# Patient Record
Sex: Male | Born: 1950 | Race: Black or African American | Hispanic: No | Marital: Married | Smoking: Former smoker
Health system: Southern US, Community
[De-identification: ages and names within clinical notes are randomized; demographics above are authoritative.]

## PROBLEM LIST (undated history)

## (undated) DIAGNOSIS — E785 Hyperlipidemia, unspecified: Secondary | ICD-10-CM

## (undated) DIAGNOSIS — I2510 Atherosclerotic heart disease of native coronary artery without angina pectoris: Secondary | ICD-10-CM

## (undated) DIAGNOSIS — F1010 Alcohol abuse, uncomplicated: Secondary | ICD-10-CM

## (undated) DIAGNOSIS — I2129 ST elevation (STEMI) myocardial infarction involving other sites: Secondary | ICD-10-CM

## (undated) DIAGNOSIS — I10 Essential (primary) hypertension: Secondary | ICD-10-CM

## (undated) HISTORY — DX: Essential (primary) hypertension: I10

## (undated) HISTORY — DX: Alcohol abuse, uncomplicated: F10.10

## (undated) HISTORY — DX: Atherosclerotic heart disease of native coronary artery without angina pectoris: I25.10

## (undated) HISTORY — DX: Hyperlipidemia, unspecified: E78.5

## (undated) HISTORY — DX: ST elevation (STEMI) myocardial infarction involving other sites: I21.29

## (undated) HISTORY — PX: OTHER SURGICAL HISTORY: SHX169

---

## 2006-02-18 ENCOUNTER — Emergency Department (HOSPITAL_COMMUNITY): Admission: EM | Admit: 2006-02-18 | Discharge: 2006-02-18 | Payer: Self-pay | Admitting: Emergency Medicine

## 2008-11-10 ENCOUNTER — Emergency Department (HOSPITAL_COMMUNITY): Admission: EM | Admit: 2008-11-10 | Discharge: 2008-11-10 | Payer: Self-pay | Admitting: Emergency Medicine

## 2009-02-24 ENCOUNTER — Ambulatory Visit: Payer: Self-pay | Admitting: Cardiology

## 2009-02-24 ENCOUNTER — Inpatient Hospital Stay (HOSPITAL_COMMUNITY): Admission: AD | Admit: 2009-02-24 | Discharge: 2009-02-26 | Payer: Self-pay | Admitting: Cardiology

## 2009-02-27 ENCOUNTER — Emergency Department (HOSPITAL_COMMUNITY): Admission: EM | Admit: 2009-02-27 | Discharge: 2009-02-27 | Payer: Self-pay | Admitting: Emergency Medicine

## 2009-02-27 ENCOUNTER — Ambulatory Visit: Payer: Self-pay | Admitting: Cardiology

## 2009-02-28 ENCOUNTER — Telehealth: Payer: Self-pay | Admitting: Cardiology

## 2009-03-07 ENCOUNTER — Encounter: Payer: Self-pay | Admitting: Cardiology

## 2009-03-07 DIAGNOSIS — I10 Essential (primary) hypertension: Secondary | ICD-10-CM | POA: Insufficient documentation

## 2009-03-07 DIAGNOSIS — I2510 Atherosclerotic heart disease of native coronary artery without angina pectoris: Secondary | ICD-10-CM | POA: Insufficient documentation

## 2009-03-07 DIAGNOSIS — E785 Hyperlipidemia, unspecified: Secondary | ICD-10-CM

## 2009-03-09 ENCOUNTER — Ambulatory Visit: Payer: Self-pay | Admitting: Cardiology

## 2009-03-09 ENCOUNTER — Encounter (INDEPENDENT_AMBULATORY_CARE_PROVIDER_SITE_OTHER): Payer: Self-pay | Admitting: *Deleted

## 2009-03-17 ENCOUNTER — Encounter: Payer: Self-pay | Admitting: Cardiology

## 2009-04-19 ENCOUNTER — Telehealth: Payer: Self-pay | Admitting: Cardiology

## 2009-04-27 ENCOUNTER — Ambulatory Visit: Payer: Self-pay | Admitting: Cardiology

## 2009-05-12 ENCOUNTER — Ambulatory Visit: Payer: Self-pay | Admitting: Cardiology

## 2009-05-17 ENCOUNTER — Encounter: Payer: Self-pay | Admitting: Cardiology

## 2009-05-19 LAB — CONVERTED CEMR LAB
AST: 21 units/L (ref 0–37)
Albumin: 4.4 g/dL (ref 3.5–5.2)
Alkaline Phosphatase: 105 units/L (ref 39–117)
BUN: 20 mg/dL (ref 6–23)
Basophils Absolute: 0 10*3/uL (ref 0.0–0.1)
Chloride: 101 meq/L (ref 96–112)
Eosinophils Relative: 2 % (ref 0–5)
HCT: 40.8 % (ref 39.0–52.0)
HDL: 32 mg/dL — ABNORMAL LOW (ref >39)
LDL Cholesterol: 112 mg/dL — ABNORMAL HIGH (ref 0–99)
Lymphocytes Relative: 35 % (ref 12–46)
Lymphs Abs: 4.5 10*3/uL — ABNORMAL HIGH (ref 0.7–4.0)
Neutro Abs: 7.1 10*3/uL (ref 1.7–7.7)
Platelets: 226 10*3/uL (ref 150–400)
Potassium: 3.8 meq/L (ref 3.5–5.3)
RDW: 14 % (ref 11.5–15.5)
Sodium: 138 meq/L (ref 135–145)
Total Protein: 7.8 g/dL (ref 6.0–8.3)
Triglycerides: 131 mg/dL (ref <150)
WBC: 12.7 10*3/uL — ABNORMAL HIGH (ref 4.0–10.5)

## 2009-06-20 ENCOUNTER — Telehealth: Payer: Self-pay | Admitting: Cardiology

## 2009-06-28 ENCOUNTER — Telehealth: Payer: Self-pay | Admitting: Cardiology

## 2009-07-10 ENCOUNTER — Telehealth: Payer: Self-pay | Admitting: Cardiology

## 2009-07-19 ENCOUNTER — Telehealth: Payer: Self-pay | Admitting: Cardiology

## 2009-07-27 ENCOUNTER — Telehealth: Payer: Self-pay | Admitting: Cardiology

## 2009-08-17 ENCOUNTER — Encounter: Payer: Self-pay | Admitting: Cardiology

## 2009-08-17 ENCOUNTER — Telehealth: Payer: Self-pay | Admitting: Cardiology

## 2009-08-25 ENCOUNTER — Ambulatory Visit: Payer: Self-pay | Admitting: Cardiology

## 2009-08-31 ENCOUNTER — Telehealth: Payer: Self-pay | Admitting: Cardiology

## 2009-09-22 ENCOUNTER — Telehealth: Payer: Self-pay | Admitting: Cardiology

## 2009-09-26 ENCOUNTER — Telehealth: Payer: Self-pay | Admitting: Cardiology

## 2009-10-04 ENCOUNTER — Telehealth (INDEPENDENT_AMBULATORY_CARE_PROVIDER_SITE_OTHER): Payer: Self-pay | Admitting: *Deleted

## 2009-10-09 ENCOUNTER — Telehealth: Payer: Self-pay | Admitting: Cardiology

## 2009-10-17 ENCOUNTER — Telehealth (INDEPENDENT_AMBULATORY_CARE_PROVIDER_SITE_OTHER): Payer: Self-pay | Admitting: *Deleted

## 2009-11-14 ENCOUNTER — Telehealth: Payer: Self-pay | Admitting: Cardiology

## 2009-11-15 ENCOUNTER — Ambulatory Visit: Payer: Self-pay | Admitting: Cardiology

## 2009-11-20 LAB — CONVERTED CEMR LAB
AST: 36 units/L (ref 0–37)
Albumin: 4.2 g/dL (ref 3.5–5.2)
Alkaline Phosphatase: 83 units/L (ref 39–117)
Cholesterol: 145 mg/dL (ref 0–200)
HDL: 33.9 mg/dL — ABNORMAL LOW (ref >39.00)
Total Protein: 7.4 g/dL (ref 6.0–8.3)

## 2009-11-27 ENCOUNTER — Telehealth: Payer: Self-pay | Admitting: Cardiology

## 2009-12-11 ENCOUNTER — Telehealth: Payer: Self-pay | Admitting: Cardiology

## 2010-01-02 ENCOUNTER — Telehealth: Payer: Self-pay | Admitting: Cardiology

## 2010-01-05 ENCOUNTER — Telehealth: Payer: Self-pay | Admitting: Cardiology

## 2010-02-05 ENCOUNTER — Telehealth: Payer: Self-pay | Admitting: Cardiology

## 2010-02-12 ENCOUNTER — Ambulatory Visit: Payer: Self-pay | Admitting: Cardiology

## 2010-02-19 ENCOUNTER — Ambulatory Visit: Payer: Self-pay | Admitting: Cardiology

## 2010-02-22 LAB — CONVERTED CEMR LAB
BUN: 20 mg/dL (ref 6–23)
CO2: 27 meq/L (ref 19–32)
Glucose, Bld: 92 mg/dL (ref 70–99)
Potassium: 3.6 meq/L (ref 3.5–5.1)
Sodium: 137 meq/L (ref 135–145)

## 2010-03-23 ENCOUNTER — Telehealth: Payer: Self-pay | Admitting: Cardiology

## 2010-04-16 ENCOUNTER — Telehealth: Payer: Self-pay | Admitting: Cardiology

## 2010-05-17 ENCOUNTER — Telehealth: Payer: Self-pay | Admitting: Cardiovascular Disease

## 2010-05-29 NOTE — Progress Notes (Signed)
Summary: sample of plavix/ LMTCB  Phone Note Call from Patient Call back at Home Phone [phone] Call back at [phone]   Caller: Spouse Reason for Call: Talk to Nurse Summary of Call: sample of plavix 75 mg.  Initial call taken by: Lorne Skeens,  Sep 26, 2009 8:34 AM  Follow-up for Phone Call        LMTCB--samples of Plavix 75 #12 on top of message cart--applicatipn  for BMS pt assistance program with samples Katina Dung, RN, BSN  Sep 26, 2009 1:58 PM   Pt's wife aware samples and application for BMS have been placed at front desk. Follow-up by: Charolotte Capuchin, RN,  Sep 26, 2009 2:17 PM

## 2010-05-29 NOTE — Progress Notes (Signed)
Summary: c/o dizziness  Phone Note Call from Patient Call back at Home Phone [phone]   Refills Requested: Medication #1:  HCL 0.1 MG TABS Take one tablet by mouth once daily x 4 days then stop Caller: Spouse Reason for Call: Talk to Nurse Details for Reason: Per pt wife calling, wants to discuss medication, CLONIDINE c/o dizziness. wife under the impression that pt was suppose to stop meds.  Initial call taken by: Lorne Skeens,  June 20, 2009 3:11 PM  Follow-up for Phone Call        S/W pt's wife and she thought that he was supposed to have stopped his Clonidine per his last OV. I confirmed that with her. She said he has been very dizzy lately. She noticed that [facility] and he has been taking it like the bottle said which is 0.1mg  two times a day. So along with his increase in Lisinopril to 20mg  daily per last OV he has remained on his clonidine. He is not there for her to check his BP for me. I ask her to check it when he gets home that his BP is probably low for him. I s/w Shelby Dubin to see if he could just stop the clonidine or if he needed to taper it. She ask that he taper. He was instructed to take 0.1mg  tablet tonight (he did not take it this morning), tomorrow night and Thurs night. I will pass this along to Geroge Baseman, RN and to Dr. Juanda Chance in case any more changes need to be made. Pt's wife understands instructions.  Follow-up by: Duncan Dull, RN, BSN,  June 20, 2009 4:08 PM  Additional Follow-up for Phone Call Additional follow up Details #1::        ( late entry) I reviewed the above with Dr. Juanda Chance late yesterday after clinic. Per Dr. Juanda Chance, he is agreeable with the above. No further changes per MD. I called and left a message at the pt's home # that Dr. Juanda Chance was aware and agreeable with the above plan. Additional Follow-up by: Sherri Rad, RN, BSN,  June 22, 2009 9:31 AM

## 2010-05-29 NOTE — Progress Notes (Signed)
Summary: samples of crestor  Phone Note Call from Patient Call back at Home Phone [phone]   Caller: Spouse- patricia  Reason for Call: Talk to Nurse Details for Reason: sample of crestor 40 mg.  Initial call taken by: Lorne Skeens,  December 11, 2009 8:32 AM  Follow-up for Phone Call        spoke with spouse, samples of crestor 20mg  given [name], RN  December 11, 2009 8:46 AM

## 2010-05-29 NOTE — Progress Notes (Signed)
Summary: Samples of Plavix 40 mg  Phone Note Call from Patient Call back at [facility] Phone [phone]   Caller: Patient Summary of Call: Samples of Plavix 40mg  Initial call taken by: Judie Grieve,  February 05, 2010 2:46 PM  Follow-up for Phone Call        Wife is aware samples will be at the desk. Mylo Red RN

## 2010-05-29 NOTE — Progress Notes (Signed)
Summary: NEED SAMPLES OF PLAVIX 75MG   Phone Note Call from Patient Call back at Home Phone [phone]   Caller: Patient Summary of Call: PT NEED SAMPLES  PLAVIX 75MG  Initial call taken by: Judie Grieve,  July 10, 2009 2:57 PM  Follow-up for Phone Call        got samples gave pt 2 boxes of 4 tablets Follow-up by: Kem Parkinson,  July 10, 2009 3:27 PM

## 2010-05-29 NOTE — Progress Notes (Signed)
  Walk in Patient Form Recieved " Pt left [employer] Form" sent to Message Nurse [facility]  October 04, 2009 8:24 AM

## 2010-05-29 NOTE — Assessment & Plan Note (Signed)
Summary: 4 MONTH ROV   Visit Type:  Follow-up Primary Provider:  dr Deforest Hoyles  CC:  no complaints.  History of Present Illness: Patient is 60 years old and return for management of CAD. 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 He has done well since that time has had no recent chest pain shortness of breath or palpitations. 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  His other problems include hypertension hyperlipidemia. When he was here last visit we DC'd his clonidine increase his lisinopril.  His main complaint today is pain in his right heel which he notices most when he gets up in the morning and walks.  Current Medications (verified): 1)  Crestor 40 Mg Tabs (Rosuvastatin Calcium) .... Take One Tablet By Mouth Daily. 2)  Metoprolol Succinate 50 Mg Xr24h-Tab (Metoprolol Succinate) .... Take One Tablet By Mouth Daily 3)  Hydrochlorothiazide 25 Mg Tabs (Hydrochlorothiazide) .... Take One Tablet By Mouth Daily. 4)  Lisinopril 20 Mg Tabs (Lisinopril) .... Take One Tablet By Mouth Daily 5)  Nitrostat 0.4 Mg Subl (Nitroglycerin) .Marland Kitchen.. 1 Tablet Under Tongue At Onset of Chest Pain; You May Repeat Every 5 Minutes For Up To 3 Doses. 6)  Plavix 75 Mg Tabs (Clopidogrel Bisulfate) .Marland Kitchen.. 1 Tab Once Daily 7)  Aspirin Ec 325 Mg Tbec (Aspirin) .... Take One Tablet By Mouth Daily  Allergies (verified): No Known Drug Allergies  Past History:  Past Medical History: Reviewed history from 03/07/2009 and no changes required. Current Problems:  CAD (ICD-414.00) HYPERLIPIDEMIA (ICD-272.4) HYPERTENSION (ICD-401.9)  Acute inferolateral ST-elevation myocardial infarction.  Epididymo-  Testicular pain    orchitis left   Review of Systems       ROS is negative except as outlined in HPI.   Vital Signs:  Patient profile:   60 year old male Height:      70 inches Weight:      178 pounds BMI:     25.63 Pulse rate:   59 / minute BP sitting:    140 / 88  (left arm) Cuff size:   large  Vitals Entered By: Burnett Kanaris, CNA (August 25, 2009 4:27 PM)  Physical Exam  Additional Exam:  Gen. Well-nourished, in no distress   Neck: No JVD, thyroid not enlarged, no carotid bruits Lungs: No tachypnea, clear without rales, rhonchi or wheezes Cardiovascular: Rhythm regular, PMI not displaced,  heart sounds  normal, no murmurs or gallops, no peripheral edema, pulses normal in all 4 extremities. Abdomen: BS normal, abdomen soft and non-tender without masses or organomegaly, no hepatosplenomegaly. MS: No deformities, no cyanosis or clubbing   Neuro:  No focal sns   Skin:  no lesions    Impression & Recommendations:  Problem # 1:  CAD (ICD-414.00)  He had an inferior MI in October of 2010 3 with a drug least into the right coronary artery. He has had no chest pain and this problem appears stable. His updated medication list for this problem includes:    Metoprolol Succinate 50 Mg Xr24h-tab (Metoprolol succinate) .Marland Kitchen... Take one tablet by mouth daily    Lisinopril 20 Mg Tabs (Lisinopril) .Marland Kitchen... Take one tablet by mouth daily    Nitrostat 0.4 Mg Subl (Nitroglycerin) .Marland Kitchen... 1 tablet under tongue at onset of chest pain; you may repeat every 5 minutes for up to 3 doses.    Plavix 75 Mg Tabs (Clopidogrel bisulfate) .Marland Kitchen... 1 tab once daily  Aspirin Ec 325 Mg Tbec (Aspirin) .Marland Kitchen... Take one tablet by mouth daily  Orders: EKG w/ Interpretation (93000)  Problem # 2:  HYPERTENSION (ICD-401.9)  His blood pressure is borderline today. We will continue current medications and observation. The following medications were removed from the medication list:    Clonidine Hcl 0.1 Mg Tabs (Clonidine hcl) .Marland Kitchen... Take one tablet by mouth once daily x 4 days then stop His updated medication list for this problem includes:    Metoprolol Succinate 50 Mg Xr24h-tab (Metoprolol succinate) .Marland Kitchen... Take one tablet by mouth daily    Hydrochlorothiazide 25 Mg Tabs  (Hydrochlorothiazide) .Marland Kitchen... Take one tablet by mouth daily.    Lisinopril 20 Mg Tabs (Lisinopril) .Marland Kitchen... Take one tablet by mouth daily    Aspirin Ec 325 Mg Tbec (Aspirin) .Marland Kitchen... Take one tablet by mouth daily  Orders: EKG w/ Interpretation (93000)  Problem # 3:  HYPERLIPIDEMIA (ICD-272.4)  His cholesterol is not at target. We think he was on a different dose of Crestor at the time this was done and we will plan a repeat lipid profile. His updated medication list for this problem includes:    Crestor 40 Mg Tabs (Rosuvastatin calcium) .Marland Kitchen... Take one tablet by mouth daily.  Orders: EKG w/ Interpretation (93000)  Patient Instructions: 1)  Your physician recommends that you return for lab work on a day you can come FASTING: lipid/liver (414.01;272.2) 2)  Your physician has recommended you make the following change in your medication: 1) START advil 400mg  two times a day  3)  Your physician wants you to follow-up in: 6 months.   You will receive a reminder letter in the mail two months in advance. If you don't receive a letter, please call our office to schedule the follow-up appointment. 4)  You have been given an order for an ankle splint to wear at night.

## 2010-05-29 NOTE — Progress Notes (Signed)
Summary: plavix samples  Phone Note Call from Patient Call back at [phone]   Caller: Spouse Reason for Call: Talk to Nurse Summary of Call: request samples of plavix Initial call taken by: Migdalia Dk,  Aug 31, 2009 8:36 AM  Follow-up for Phone Call        [facility]    Samples of Plavix are ready. Lisabeth Devoid RN 09/01/09--spoke with wife and she states they picked up plavix samples 08/31/09--nt Follow-up by: Ledon Snare, RN,  Sep 01, 2009 8:41 AM

## 2010-05-29 NOTE — Assessment & Plan Note (Signed)
Summary: f26m   Visit Type:  Follow-up Primary Tirzah Fross:  dr Deforest Hoyles   History of Present Illness: Patient is 60 years old and return for management of CAD. 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 He has done well since that time has had no recent chest pain shortness of breath or palpitations. 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  His other problems include hypertension hyperlipidemia.   His main complaint today is pain in his right heel which he notices most when he gets up in the morning and walks.  Current Medications (verified): 1)  Crestor 40 Mg Tabs (Rosuvastatin Calcium) .... Take One Tablet By Mouth Daily. 2)  Metoprolol Succinate 50 Mg Xr24h-Tab (Metoprolol Succinate) .... Take One Tablet By Mouth Daily 3)  Hydrochlorothiazide 25 Mg Tabs (Hydrochlorothiazide) .... Take One Tablet By Mouth Daily. 4)  Lisinopril 20 Mg Tabs (Lisinopril) .... Take One Tablet By Mouth Daily 5)  Nitrostat 0.4 Mg Subl (Nitroglycerin) .Marland Kitchen.. 1 Tablet Under Tongue At Onset of Chest Pain; You May Repeat Every 5 Minutes For Up To 3 Doses. 6)  Plavix 75 Mg Tabs (Clopidogrel Bisulfate) .Marland Kitchen.. 1 Tab Once Daily 7)  Aspirin Ec 325 Mg Tbec (Aspirin) .... Take One Tablet By Mouth Daily 8)  Advil 200 Mg Tabs (Ibuprofen) .... Take Two Tabs Two Times A Day  Allergies (verified): No Known Drug Allergies  Past History:  Past Medical History: Last updated: 03/07/2009 Current Problems:  CAD (ICD-414.00) HYPERLIPIDEMIA (ICD-272.4) HYPERTENSION (ICD-401.9)  Acute inferolateral ST-elevation myocardial infarction.  Epididymo-  Testicular pain    orchitis left   Review of Systems       Negative except per HPI  Vital Signs:  Patient profile:   60 year old male Height:      70 inches Weight:      184 pounds BMI:     26.50 Pulse rate:   65 / minute BP sitting:   150 / 90  (left arm) Cuff size:   large  Vitals Entered By: Burnett Kanaris, CNA  (February 12, 2010 9:21 AM)  Physical Exam  General:  Well developed, well nourished, in no acute distress. Neck:  Neck supple, no JVD. No masses, thyromegaly or abnormal cervical nodes. Lungs:  Clear bilaterally to auscultation and percussion. Heart:  normal rate, regular rhythm, 1/6 systolic murmur, no gallop, and no rub.      Impression & Recommendations:  Problem # 1:  CAD (ICD-414.00) Assessment Comment Only  He had an inferior MI in October of 2010 3 with a drug least into the right coronary artery. He has had no chest pain and this problem appears stable. will continue plavix for now given that pt is tolerating it well and does not have trouble paying for it.   His updated medication list for this problem includes:    Metoprolol Succinate 50 Mg Xr24h-tab (Metoprolol succinate) .Marland Kitchen... Take one tablet by mouth daily    Lisinopril 40 Mg Tabs (Lisinopril) .Marland Kitchen... Take one tablet by mouth daily    Nitrostat 0.4 Mg Subl (Nitroglycerin) .Marland Kitchen... 1 tablet under tongue at onset of chest pain; you may repeat every 5 minutes for up to 3 doses.    Plavix 75 Mg Tabs (Clopidogrel bisulfate) .Marland Kitchen... 1 tab once daily    Aspirin Ec 325 Mg Tbec (Aspirin) .Marland Kitchen... Take one tablet by mouth daily  Orders: EKG w/ Interpretation (93000)  Problem # 2:  HYPERTENSION (  ICD-401.9) Assessment: Comment Only  HP slightly elevated today, will increase his lisinopril dose to 40mg  and check BMET in one week.   BP today: 150/90 Prior BP: 140/88 (08/25/2009)  Labs Reviewed: K+: 3.8 (05/12/2009) Creat: : 0.90 (05/12/2009)   Chol: 145 (11/15/2009)   HDL: 33.90 (11/15/2009)   LDL: 78 (11/15/2009)   TG: 164.0 (11/15/2009)  Orders: EKG w/ Interpretation (93000)  Problem # 3:  HYPERLIPIDEMIA (ICD-272.4) Assessment: Comment Only  Well controlled on current treatment, No new changes made today, Will continue to monitor.   His updated medication list for this problem includes:    Crestor 40 Mg Tabs (Rosuvastatin  calcium) .Marland Kitchen... Take one tablet by mouth daily.  CHOL: 145 (11/15/2009)   LDL: 78 (11/15/2009)   HDL: 33.90 (11/15/2009)   TG: 164.0 (11/15/2009)  Orders: EKG w/ Interpretation (93000)  Patient Instructions: 1)  Increase lisinopril to 40mg  once daily. 2)  Labwork in 5-7 days: bmet (414.01). 3)  Your physician wants you to follow-up in: 1 year with Dr. Clifton James.   You will receive a reminder letter in the mail two months in advance. If you don't receive a letter, please call our office to schedule the follow-up appointment. Prescriptions: LISINOPRIL 40 MG TABS (LISINOPRIL) Take one tablet by mouth daily  #30 x 11   Entered by:   Sherri Rad, RN, BSN   Authorized by:   Lenoria Farrier, MD, [place]   Signed by:   Sherri Rad, RN, BSN on 02/12/2010   Method used:   [facility]  [address] [id number]       [address]       [place], [place]  [address]       Ph: [phone] or [phone]       Fax: [phone]   RxID:   [id number]

## 2010-05-29 NOTE — Progress Notes (Signed)
Summary: samples of crestor  Phone Note Call from Patient Call back at Home Phone [phone]   Caller: Spouse Reason for Call: Talk to Nurse Summary of Call: samples of crestor 40 mg.  Initial call taken by: Lorne Skeens,  November 14, 2009 8:38 AM  Follow-up for Phone Call        Spoke with pt's wife. Pt has been taking Crestor 40 mg by mouth daily. Pt was scheduled to have lipid and liver checked on 08/30/09 but missed this appt.  I explained to wife that fasting lab work needed to be checked to make sure pt was on appropriate dose of meds. She will have pt call us to schedule time for lab work to be drawn this week.  I told wife I would leave one week of Crestor 20 mg samples ( no 40 mg samples in office)  at front desk to be picked up. Wife aware pt is to take 2 tablets daily. Lot  [id number],exp.  2/14,14 tablets. Dossie Arbour, RN, BSN  November 14, 2009 9:05 AM

## 2010-05-29 NOTE — Progress Notes (Signed)
  Phone Note Outgoing Call   Call placed by: Scherrie Bateman, LPN,  October 17, 2009 1:10 PM Call placed to: Patient'S WIFE Summary of Call: PT'S WIFE AWARE PLAVIX RECEIVED WILL LEAVE AT FRONT DESK FOR PICK . Initial call taken by: Scherrie Bateman, LPN,  October 17, 2009 1:11 PM

## 2010-05-29 NOTE — Progress Notes (Signed)
Summary: Plavix (BMS)  Phone Note Outgoing Call   Call placed by: Sherri Rad, RN, BSN,  January 05, 2010 4:16 PM Summary of Call: Patient's wife is aware the pt's Plavix is at the front desk.  Initial call taken by: Sherri Rad, RN, BSN,  January 05, 2010 4:17 PM

## 2010-05-29 NOTE — Progress Notes (Signed)
Summary: Plavix  Phone Note Call from Patient Call back at Home Phone [phone] Call back at [phone]   Caller: Spouse Reason for Call: Talk to Nurse Details for Reason: sample of plavix 75 mg.  Initial call taken by: Lorne Skeens,  July 19, 2009 12:15 PM  Follow-up for Phone Call        I spoke with the pt's wife and placed 12 tablets of Plavix at the front desk for pick-up. Follow-up by: Julieta Gutting, RN, BSN,  July 19, 2009 12:26 PM

## 2010-05-29 NOTE — Letter (Signed)
Summary: [facility] Program  [facility]iac Rehabilitation Program   Imported By: Kassie Mends 05/15/2009 07:52:36  _____________________________________________________________________  External Attachment:    Type:   Image     Comment:   External Document

## 2010-05-29 NOTE — Progress Notes (Signed)
Summary: CALL REGARDING PLAVIX  Phone Note Call from Patient Call back at Home Phone [phone] Call back at [phone]   Caller: Spouse/[name] Summary of Call: CALLING REGRADING MEDICATION (PLAVIX) Initial call taken by: Judie Grieve,  June 28, 2009 3:22 PM  Follow-up for Phone Call        I spoke with the pt's wife. After discussion with Dr. Juanda Chance, the pt will need to stay on plavix. They are having a cost issue with Plavix b/c of the high dedutible they have to meet. I explained that due to the risk of reoccluding his stent, that the pt will have to be on this for now. He is still smoking some per pt's wife- I explained this increases the pt's risk even further if he comes off plavix. I have pulled some samples and will place these at the front desk. The pt's wife verbalizes understanding. Follow-up by: Sherri Rad, RN, BSN,  June 28, 2009 6:19 PM    New/Updated Medications: PLAVIX 75 MG TABS (CLOPIDOGREL BISULFATE) 1 tab once daily Prescriptions: PLAVIX 75 MG TABS (CLOPIDOGREL BISULFATE) 1 tab once daily  #12 x 0   Entered by:   Sherri Rad, RN, BSN   Authorized by:   Lenoria Farrier, MD, [facility]   Signed by:   Sherri Rad, RN, BSN on 06/28/2009   Method used:   Samples Given   RxID:   [id number]

## 2010-05-29 NOTE — Progress Notes (Signed)
Summary: rx samples  crestor samples put at front desk  Phone Note Refill Request Message from:  Patient on March 23, 2010 1:22 PM  Refills Requested: Medication #1:  CRESTOR 40 MG TABS Take one tablet by mouth daily. pt would like to know if we have any sample. pt #[phone]   Method Requested: Telephone to Pharmacy Initial call taken by: Roe Coombs,  March 23, 2010 1:22 PM  Follow-up for Phone Call        per pt wife calling in re to sample of crestor 20 mg. [phone] Lorne Skeens  March 26, 2010 8:31 AM  Follow-up by: Judithe Modest CMA,  March 23, 2010 5:05 PM  Additional Follow-up for Phone Call Additional follow up Details #1::        samples of crestor 20  [id number] exp 2/14 #42 Additional Follow-up by: Charolotte Capuchin, RN,  March 26, 2010 10:47 AM

## 2010-05-29 NOTE — Progress Notes (Signed)
Summary: req samples  Phone Note Call from Patient Call back at Home Phone [phone]   Caller: Patient Summary of Call: needs crestor samples Initial call taken by: Migdalia Dk,  August 17, 2009 8:51 AM  Follow-up for Phone Call        The pt's wife is aware that samples have been pulled for the pt. We only had Crestor 20mg  samples. She is aware that the pt should take two tabs daily of the 20mg  tabs.  Follow-up by: Sherri Rad, RN, BSN,  August 17, 2009 9:01 AM    New/Updated Medications: CRESTOR 20 MG TABS (ROSUVASTATIN CALCIUM) Take one tablet by mouth daily. Prescriptions: CRESTOR 20 MG TABS (ROSUVASTATIN CALCIUM) Take one tablet by mouth daily.  #28 x 0   Entered by:   Sherri Rad, RN, BSN   Authorized by:   Lenoria Farrier, MD, [facility]   Signed by:   Sherri Rad, RN, BSN on 08/17/2009   Method used:   Samples Given   RxID:   [id number]

## 2010-05-29 NOTE — Progress Notes (Signed)
Summary: plavix samples  Phone Note Call from Patient Call back at [phone]   Caller: Spouse Reason for Call: Talk to Nurse Summary of Call: request plavix samples Initial call taken by: Migdalia Dk,  Sep 22, 2009 8:36 AM  Follow-up for Phone Call        No samples available at this time.  Left message for spouse to call back Monday.  Also instructed spouse that if they dont have RX drug coverage with their insurance we may be able to help get them assistance thru BMS. Follow-up by: Charolotte Capuchin, RN,  Sep 22, 2009 8:54 AM

## 2010-05-29 NOTE — Miscellaneous (Signed)
  Clinical Lists Changes  Medications: Changed medication from CRESTOR 20 MG TABS (ROSUVASTATIN CALCIUM) Take one tablet by mouth daily. to CRESTOR 40 MG TABS (ROSUVASTATIN CALCIUM) Take one tablet by mouth daily.

## 2010-05-29 NOTE — Letter (Signed)
Summary: [employer] Authorization  [employer] Authorization   Imported By: Roderic Ovens 05/31/2009 14:14:53  _____________________________________________________________________  External Attachment:    Type:   Image     Comment:   External Document

## 2010-05-29 NOTE — Progress Notes (Signed)
Summary: Need samples on Crestor  Phone Note Call from Patient Call back at Home Phone [phone]   Caller: Patient Summary of Call: Samples Crestor 20mg  or 40mg  Initial call taken by: Judie Grieve,  November 27, 2009 8:35 AM  Follow-up for Phone Call        CRESTOR 20 MG #28 LOT # [id number] EXP 4/14 SAMPLES LEFT AT FRONT DESK . PT'S WIFE AWARE. Follow-up by: Scherrie Bateman, LPN,  November 27, 2009 8:46 AM

## 2010-05-29 NOTE — Progress Notes (Signed)
Summary: gentric for crestor  Phone Note Call from Patient Call back at Home Phone [phone] Call back at [phone]   Caller: Spouse Reason for Call: Talk to Nurse Summary of Call: is their a gentric for crestor, the cost $ 137.00.  Initial call taken by: Lorne Skeens,  July 27, 2009 2:33 PM  Follow-up for Phone Call        Spoke with pt's wife regarding Crestor medication. She states medication is too expencive . She would like to know if there is a Genetic form of Crestor.  I let pt's wife know there is a genitic form of this medication. She needs to ask for the genetic when she go to refill the prescription. Pt's wife would like to have some samples of Crestor 40 mg and Plavix 75 mg. Crestor 20 mg 35 pills placed to front desk. RN explaned to wife pt. needs to take 2 pills (40 mg) daily, Plavis 75 mg 12 sample pills were given by Leotis Shames on 07/19/09. wife said pt. only has 5 or 6 pills now, 8 sample pills and "[facility] individual patient  assistance program form"  placed at the front desk to fill out and sign. Form explaned to pt's wife. Ollen Gross, RN, BSN  July 27, 2009 4:16 PM  Ollen Gross, RN, BSN  July 27, 2009 4:11 PM   Additional Follow-up for Phone Call Additional follow up Details #1::        Phone Call Completed      Additional Follow-up for Phone Call Additional follow up Details #2::    I discussed the above with Dr. Juanda Chance. Orders received that we could change the pt to simvastatin 40mg  once daily, but control on his lipids may not be as good. I have called the pt's wife and made her aware of this. She states that they picked up samples yesterday of Crestor to last him until his f/u appt. I stated we would hold off on switching the pt to a different statin until his f/u appt. The pt's wife is agreeable.  Follow-up by: Sherri Rad, RN, BSN,  July 28, 2009 6:39 PM

## 2010-05-29 NOTE — Progress Notes (Signed)
Summary: smaples of plavix   Phone Note Call from Patient Call back at Home Phone [phone] Call back at [phone]   Caller: Spouse Reason for Call: Talk to Nurse Summary of Call: pt wife states that pt needs to get some samples of Plavix took last one this am. Script is to high for pt to pay for. [phone] Initial call taken by: Edman Circle,  October 09, 2009 8:07 AM  Follow-up for Phone Call        10/09/09--plavix samples to front desk--wife notified to pic up--nt Follow-up by: Ledon Snare, RN,  October 09, 2009 8:52 AM

## 2010-05-29 NOTE — Progress Notes (Signed)
Summary: NEED SAMPLES AND PLAVIX ORDERED  Phone Note Call from Patient Call back at Home Phone [phone]   Caller: PT Reason for Call: Refill Medication Summary of Call: PT NEEDS PLAVIX 75MG  ORDERED AND NEEDS SOME SAPLES OF CRESTOR 40MG . Initial call taken by: Faythe Ghee,  January 02, 2010 3:18 PM  Follow-up for Phone Call        Samples were given today at 2:36pm  Crestor 20 mg pt must take two to equal 40 mg Follow-up by: Burnett Kanaris, CNA,  January 03, 2010 2:37 PM

## 2010-05-31 NOTE — Progress Notes (Signed)
Summary: reorder plavix/samples of crestor  Phone Note Call from Patient   Caller: Patient Summary of Call: pt needs to reorder plavix-pls call when ready [phone] and also wants crestor samples of 40mg   Initial call taken by: Glynda Jaeger,  April 16, 2010 8:41 AM  Follow-up for Phone Call        I called [facility] today and placed an order for the pt's plavix. I have also pulled samples of Crestor 20mg  # 40 tabs. I have called and made the pt's wife aware of the above and instructed her to have the pt take 2 tabs of Crestor once daily. Follow-up by: Sherri Rad, RN, BSN,  April 16, 2010 4:21 PM

## 2010-05-31 NOTE — Progress Notes (Signed)
Summary: Requesting Crestor Samples  Phone Note Other Incoming Call back at Walk in   Summary of Call: Pt's Spouse came into office requesting Crestor samples.  RN provided Pt's Spouse with 1 box of 10mg  amd instructed to take 4 daily and 2 boxes of 20mg  and instructed to take 2 daily. Rx assistance card and 30 day supply card also provided. Bernita Raisin, RN, BSN  May 17, 2010 3:30PM

## 2010-08-01 LAB — POCT I-STAT, CHEM 8
BUN: 15 mg/dL (ref 6–23)
Calcium, Ion: 1.23 mmol/L (ref 1.12–1.32)
Creatinine, Ser: 1.1 mg/dL (ref 0.4–1.5)
Glucose, Bld: 103 mg/dL — ABNORMAL HIGH (ref 70–99)
Sodium: 142 mEq/L (ref 135–145)
TCO2: 27 mmol/L (ref 0–100)

## 2010-08-01 LAB — DIFFERENTIAL
Basophils Absolute: 0 10*3/uL (ref 0.0–0.1)
Basophils Relative: 0 % (ref 0–1)
Eosinophils Absolute: 0.2 10*3/uL (ref 0.0–0.7)
Eosinophils Relative: 2 % (ref 0–5)
Monocytes Absolute: 0.6 10*3/uL (ref 0.1–1.0)

## 2010-08-01 LAB — CK TOTAL AND CKMB (NOT AT ARMC): CK, MB: 5.8 ng/mL — ABNORMAL HIGH (ref 0.3–4.0)

## 2010-08-01 LAB — CBC
HCT: 44.6 % (ref 39.0–52.0)
Hemoglobin: 14.8 g/dL (ref 13.0–17.0)
MCHC: 33.3 g/dL (ref 30.0–36.0)
Platelets: 155 10*3/uL (ref 150–400)
RDW: 15.1 % (ref 11.5–15.5)

## 2010-08-02 LAB — POCT I-STAT, CHEM 8
Chloride: 102 mEq/L (ref 96–112)
Creatinine, Ser: 0.9 mg/dL (ref 0.4–1.5)
Hemoglobin: 15 g/dL (ref 13.0–17.0)
Potassium: 3.3 mEq/L — ABNORMAL LOW (ref 3.5–5.1)
Sodium: 136 mEq/L (ref 135–145)

## 2010-08-02 LAB — COMPREHENSIVE METABOLIC PANEL
ALT: 23 U/L (ref 0–53)
AST: 95 U/L — ABNORMAL HIGH (ref 0–37)
Albumin: 3.7 g/dL (ref 3.5–5.2)
Alkaline Phosphatase: 88 U/L (ref 39–117)
Potassium: 3.7 mEq/L (ref 3.5–5.1)
Sodium: 137 mEq/L (ref 135–145)
Total Protein: 6.7 g/dL (ref 6.0–8.3)

## 2010-08-02 LAB — CARDIAC PANEL(CRET KIN+CKTOT+MB+TROPI)
CK, MB: 134.9 ng/mL — ABNORMAL HIGH (ref 0.3–4.0)
Relative Index: 14.5 — ABNORMAL HIGH (ref 0.0–2.5)
Total CK: 1021 U/L — ABNORMAL HIGH (ref 7–232)
Total CK: 1081 U/L — ABNORMAL HIGH (ref 7–232)
Total CK: 1946 U/L — ABNORMAL HIGH (ref 7–232)
Troponin I: 24.2 ng/mL (ref 0.00–0.06)
Troponin I: 41.73 ng/mL (ref 0.00–0.06)

## 2010-08-02 LAB — BASIC METABOLIC PANEL
BUN: 15 mg/dL (ref 6–23)
CO2: 26 mEq/L (ref 19–32)
Calcium: 8.2 mg/dL — ABNORMAL LOW (ref 8.4–10.5)
Calcium: 8.8 mg/dL (ref 8.4–10.5)
Calcium: 8.8 mg/dL (ref 8.4–10.5)
Creatinine, Ser: 1.03 mg/dL (ref 0.4–1.5)
GFR calc Af Amer: >60 mL/min (ref >60)
GFR calc Af Amer: >60 mL/min (ref >60)
GFR calc non Af Amer: >60 mL/min (ref >60)
GFR calc non Af Amer: >60 mL/min (ref >60)
Glucose, Bld: 102 mg/dL — ABNORMAL HIGH (ref 70–99)
Potassium: 3.7 mEq/L (ref 3.5–5.1)
Sodium: 135 mEq/L (ref 135–145)
Sodium: 136 mEq/L (ref 135–145)

## 2010-08-02 LAB — CBC
HCT: 38.9 % — ABNORMAL LOW (ref 39.0–52.0)
HCT: 40.8 % (ref 39.0–52.0)
Hemoglobin: 13.3 g/dL (ref 13.0–17.0)
Hemoglobin: 13.7 g/dL (ref 13.0–17.0)
MCHC: 33.4 g/dL (ref 30.0–36.0)
MCV: 89.4 fL (ref 78.0–100.0)
Platelets: 132 10*3/uL — ABNORMAL LOW (ref 150–400)
Platelets: 148 10*3/uL — ABNORMAL LOW (ref 150–400)
RBC: 4.54 MIL/uL (ref 4.22–5.81)
RDW: 14.6 % (ref 11.5–15.5)
WBC: 10.1 10*3/uL (ref 4.0–10.5)

## 2010-08-02 LAB — LIPID PANEL
HDL: 34 mg/dL — ABNORMAL LOW (ref >39)
Total CHOL/HDL Ratio: 7.5 RATIO

## 2010-08-08 ENCOUNTER — Telehealth: Payer: Self-pay | Admitting: Cardiology

## 2010-08-08 ENCOUNTER — Telehealth: Payer: Self-pay | Admitting: Cardiovascular Disease

## 2010-08-08 DIAGNOSIS — I2510 Atherosclerotic heart disease of native coronary artery without angina pectoris: Secondary | ICD-10-CM

## 2010-08-08 MED ORDER — CLOPIDOGREL BISULFATE 75 MG PO TABS: 75.0000 mg | ORAL_TABLET | Freq: Every day | ORAL | Status: DC

## 2010-08-08 NOTE — Telephone Encounter (Signed)
Refilled patient's Plavix

## 2010-08-08 NOTE — Telephone Encounter (Signed)
Walk In Pt Form " pt Needs Plavix " sent to [facility] 08/08/10/KM

## 2010-08-11 ENCOUNTER — Other Ambulatory Visit: Payer: Self-pay | Admitting: *Deleted

## 2010-08-11 DIAGNOSIS — I2510 Atherosclerotic heart disease of native coronary artery without angina pectoris: Secondary | ICD-10-CM

## 2010-08-11 MED ORDER — CLOPIDOGREL BISULFATE 75 MG PO TABS: 75.0000 mg | ORAL_TABLET | Freq: Every day | ORAL | Status: DC

## 2010-08-16 ENCOUNTER — Telehealth: Payer: Self-pay | Admitting: Cardiovascular Disease

## 2010-08-16 NOTE — Telephone Encounter (Signed)
Pt needs plavix 75mg .

## 2010-08-16 NOTE — Telephone Encounter (Signed)
This was done on the 14th

## 2010-08-23 NOTE — Telephone Encounter (Signed)
Plavix faxed to [facility]. I have asked them to please rush this order. Patient aware.

## 2010-08-23 NOTE — Telephone Encounter (Signed)
When plavix comes in please call wife on her cell# [phone] due to her house phone not working correctly

## 2010-08-27 IMAGING — CR DG CHEST 2V
2 series · 2 of 2 positions shown · non-contrast
Comparison: None

CLINICAL DATA: Smoker, high blood pressure

CHEST - 2 VIEW

[w chest pa]
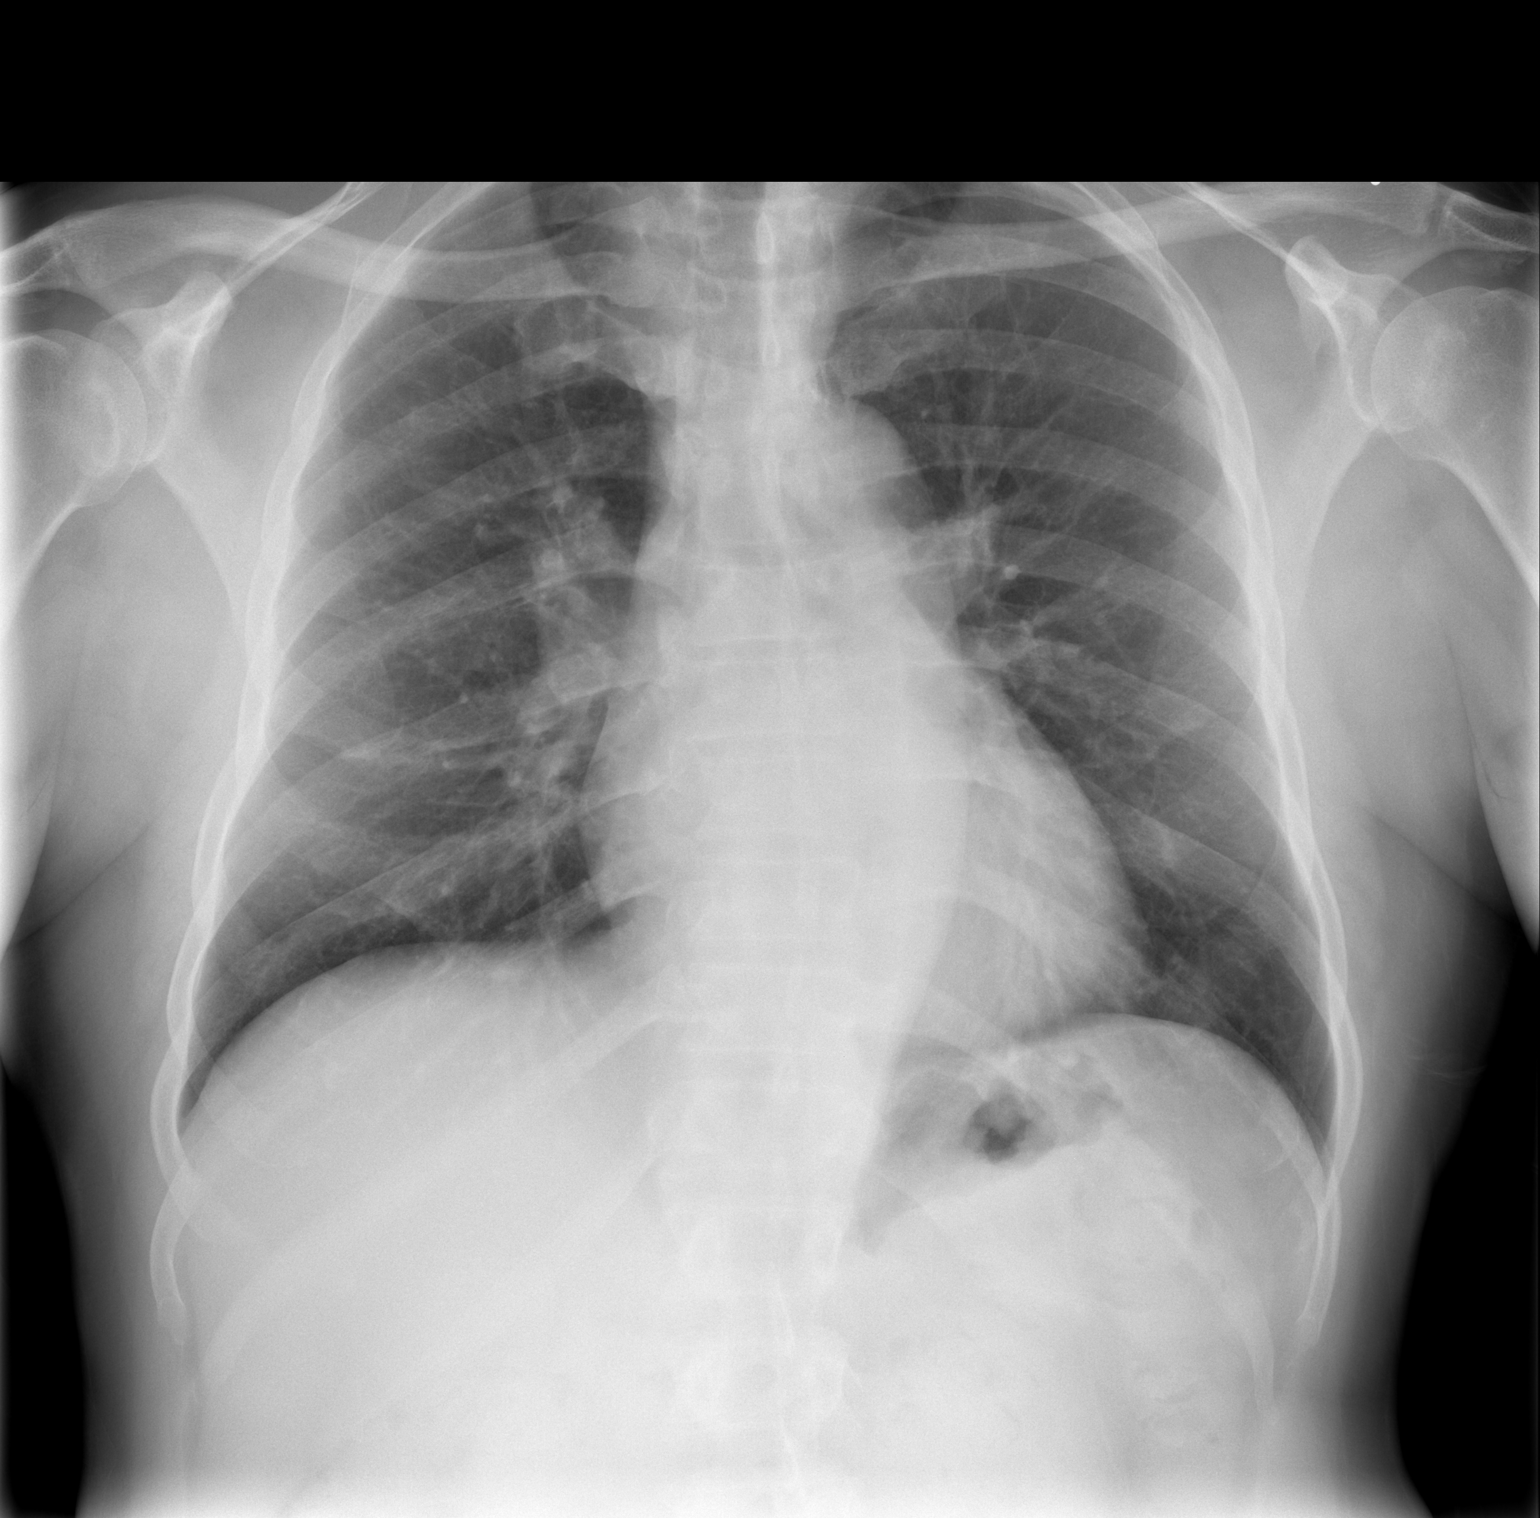

[w chest lat]
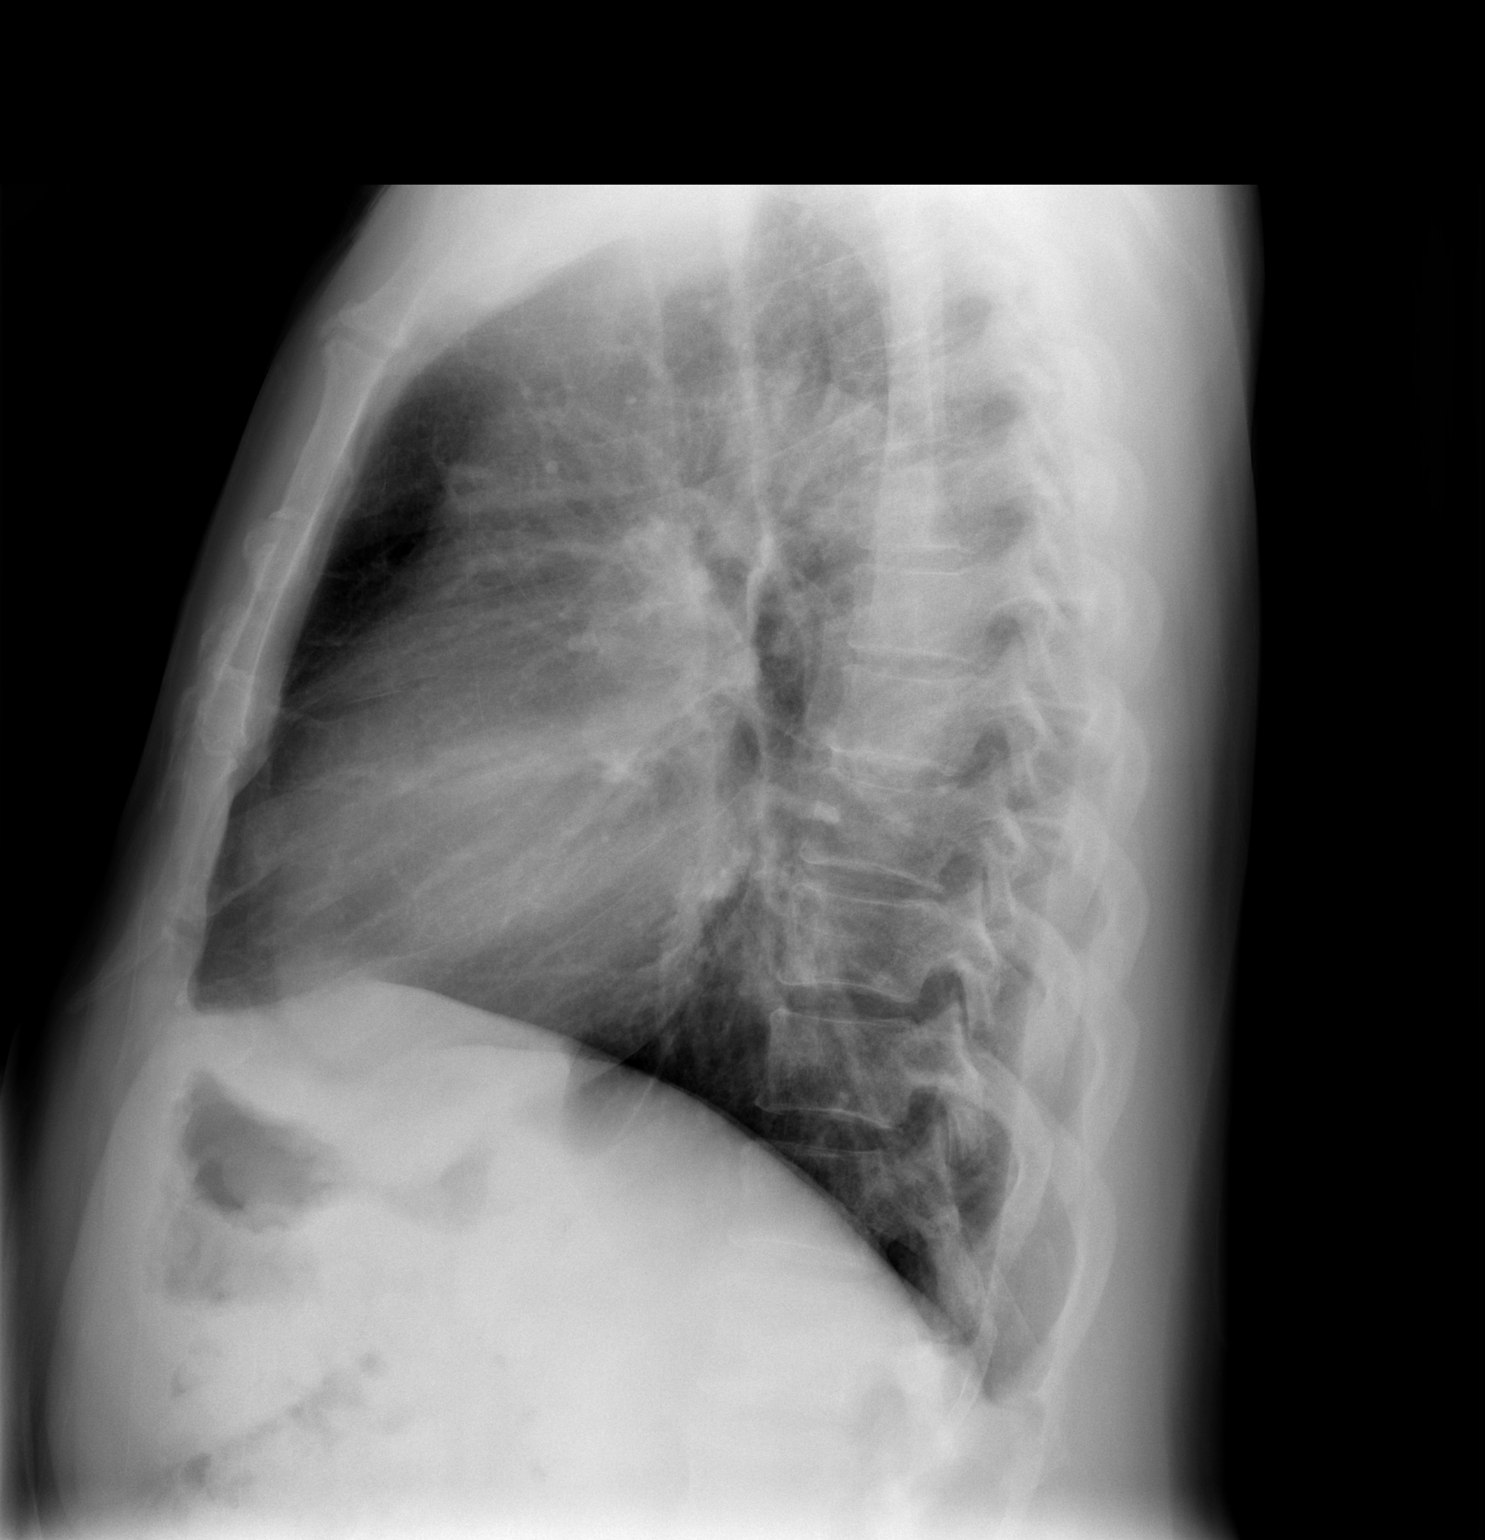

[2 of 2 positions shown; findings below may reference images not displayed]

FINDINGS: Cardiomediastinal silhouette is unremarkable.  No acute
infiltrates or pleural effusion.  Bilateral central mild increased
bronchial markings suspicious for mild bronchitic changes.
IMPRESSION: No acute infiltrate or pleural effusion.  Bilateral central mild
increased bronchial markings suspicious for mild bronchitic
changes.

## 2010-08-31 ENCOUNTER — Telehealth: Payer: Self-pay | Admitting: *Deleted

## 2010-08-31 NOTE — Telephone Encounter (Signed)
Left message for pt, plavix from [facility] at the front desk for pick up [name]

## 2010-09-04 ENCOUNTER — Telehealth: Payer: Self-pay | Admitting: Cardiovascular Disease

## 2010-09-04 NOTE — Telephone Encounter (Signed)
Picked up Plavix yesterday but he also needs Crestor 40 Mg.  He needs as many samples as possible.

## 2010-09-05 NOTE — Telephone Encounter (Signed)
We only have 20 mg in the back which is what i gave pt with a note attached stating take 2 tabs daily to equal 40 mg. Called pt number is disconnected and called work the state he is out of the building  on a delivery. Will leave samples at front desk

## 2010-09-20 ENCOUNTER — Telehealth: Payer: Self-pay | Admitting: Cardiovascular Disease

## 2010-09-20 NOTE — Telephone Encounter (Signed)
Would like to get samples of Crestor 40 mg or 20's will work.  Please call her and advise if you have any for her to pick up.  Pt has 4 pills left.

## 2010-09-21 NOTE — Telephone Encounter (Signed)
Attempted to call patient but answering machine turned off.  We do not have 20 or 40 mg Crestor samples. We can refill medication for patient. There are assistance cards as well.

## 2010-09-25 NOTE — Telephone Encounter (Signed)
Patient walked in & samples given to patient.

## 2010-10-12 ENCOUNTER — Telehealth: Payer: Self-pay | Admitting: Cardiovascular Disease

## 2010-10-12 NOTE — Telephone Encounter (Signed)
Pt needs samples of crestor 40mg °

## 2010-10-12 NOTE — Telephone Encounter (Signed)
PT'S WIFE AWARE CRESTOR SAMPLES LEFT AT FRONT DESK .Zack Seal

## 2010-10-25 ENCOUNTER — Telehealth: Payer: Self-pay | Admitting: Cardiovascular Disease

## 2010-10-25 ENCOUNTER — Telehealth: Payer: Self-pay | Admitting: *Deleted

## 2010-10-25 NOTE — Telephone Encounter (Signed)
Pt wife calling wanting Crestor samples 20 mg if available.

## 2010-10-25 NOTE — Telephone Encounter (Signed)
Pt's wife aware received plavix from [facility]  Will leave at front desk for pick up./cy

## 2010-10-25 NOTE — Telephone Encounter (Signed)
Samples of crestor 20mg  left at front desk. Pt notified.  Lot# [id number] EXP: 11/14

## 2010-12-04 ENCOUNTER — Telehealth: Payer: Self-pay | Admitting: Cardiovascular Disease

## 2010-12-04 NOTE — Telephone Encounter (Signed)
Samples of crestor 40 mg.

## 2011-01-15 ENCOUNTER — Telehealth: Payer: Self-pay | Admitting: Cardiovascular Disease

## 2011-01-15 NOTE — Telephone Encounter (Signed)
Pt's wife calling requesting samples of crestor 20 mg for pt

## 2011-01-15 NOTE — Telephone Encounter (Signed)
Spoke with wife. Our records indicate pt takes Crestor 40 mg by mouth daily and wife confirms this dose. I told wife I would leave samples of Crestor 20 mg at front desk to be picked up.  Wife aware pt will need to take 2 daily to total 40 mg.   Crestor 20mg --42 tablets, Lot [id number], exp. 03/15 left at desk.

## 2011-01-15 NOTE — Telephone Encounter (Signed)
Left message to call back--need to clarify dose.

## 2011-02-14 ENCOUNTER — Encounter: Payer: Self-pay | Admitting: Cardiovascular Disease

## 2011-02-15 ENCOUNTER — Ambulatory Visit (INDEPENDENT_AMBULATORY_CARE_PROVIDER_SITE_OTHER): Payer: PRIVATE HEALTH INSURANCE | Admitting: Cardiovascular Disease

## 2011-02-15 ENCOUNTER — Encounter: Payer: Self-pay | Admitting: Cardiovascular Disease

## 2011-02-15 VITALS — BP 150/82 | HR 62 | Ht 68.5 in | Wt 179.0 lb

## 2011-02-15 DIAGNOSIS — I2510 Atherosclerotic heart disease of native coronary artery without angina pectoris: Secondary | ICD-10-CM

## 2011-02-15 DIAGNOSIS — I10 Essential (primary) hypertension: Secondary | ICD-10-CM

## 2011-02-15 MED ORDER — ASPIRIN 81 MG PO TABS: 81.0000 mg | ORAL_TABLET | Freq: Every day | ORAL | Status: DC

## 2011-02-15 NOTE — Assessment & Plan Note (Signed)
BP is slightly elevated today. He is on high doses of HCTZ, Lisinopril. His HR is 60 today so I do not want to increase his beta blocker. He will check his BP at home and if it remains elevated, he will let us know and we would probably add Norvasc 5 mg once daily.

## 2011-02-15 NOTE — Patient Instructions (Signed)
Your physician wants you to follow-up in:  12 months.  You will receive a reminder letter in the mail two months in advance. If you don't receive a letter, please call our office to schedule the follow-up appointment.  Your physician has recommended you make the following change in your medication: Decrease aspirin to 81 mg by mouth daily   

## 2011-02-15 NOTE — Progress Notes (Signed)
History of Present Illness: 60 yo AAM with history of CAD, HTN, HLD here today for cardiac follow up. He has been followed in the past by Dr. Juanda Chance.  In October of 2010 he had an inferior MI treated with a drug-eluting  stent to the RCA. 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  He has been doing well. No chest pain, SOB or palpitations. He has been taking all meds.      Past Medical History  Diagnosis Date  . CAD (coronary artery disease)   . Hyperlipidemia   . Hypertension   . ST elevation myocardial infarction (STEMI) of true posterior wall     Past Surgical History  Procedure Date  . St elevation myocardial infarction (stemi) of true posterior wall     with the distal RCA  totalled and treated with a drug-eluting stent    Current Outpatient Prescriptions  Medication Sig Dispense Refill  . aspirin 325 MG tablet Take 325 mg by mouth daily.        . clopidogrel (PLAVIX) 75 MG tablet Take 1 tablet (75 mg total) by mouth daily.  90 tablet  3  . hydrochlorothiazide (HYDRODIURIL) 25 MG tablet Take 25 mg by mouth daily.        Marland Kitchen ibuprofen (ADVIL,MOTRIN) 200 MG tablet Take 200 mg by mouth 2 (two) times daily.        Marland Kitchen lisinopril (PRINIVIL,ZESTRIL) 40 MG tablet Take 40 mg by mouth daily.        . metoprolol (TOPROL-XL) 50 MG 24 hr tablet Take 50 mg by mouth daily.        . nitroGLYCERIN (NITROSTAT) 0.4 MG SL tablet Place 0.4 mg under the tongue every 5 (five) minutes as needed. Take 1 tablet under tongue at onset of chest pain; you may repeat every 5 minutes for up to 3 doses.       . rosuvastatin (CRESTOR) 40 MG tablet Take 40 mg by mouth daily.          No Known Allergies  History   Social History  . Marital Status: Single    Spouse Name: N/A    Number of Children: N/A  . Years of Education: N/A   Occupational History  . Not on file.   Social History Main Topics  . Smoking status: Former Games developer  . Smokeless tobacco: Not on file  . Alcohol Use: 0.6 oz/week     1 Shots of liquor per week  . Drug Use: No     denies  . Sexually Active: Not on file   Other Topics Concern  . Not on file   Social History Narrative    He lives in [place] with his wife.  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  He has a 40-pack-year history of tobacco use but has not  had a cigarette in the last few days.  He drinks a pine of vodka a day.  He denies drug use    Family History  Problem Relation Age of Onset  . Cancer Father 58  . Coronary artery disease      siblings    Review of Systems:  As stated in the HPI and otherwise negative.   BP 150/82  Pulse 62  Ht 5' 8.5" (1.74 m)  Wt 179 lb (81.194 kg)  BMI 26.82 kg/m2  Physical Examination: General: Well developed, well nourished, NAD HEENT: OP clear, mucus membranes moist SKIN: warm, dry. No rashes. Neuro: No  focal deficits Musculoskeletal: Muscle strength 5/5 all ext Psychiatric: Mood and affect normal Neck: No JVD, no carotid bruits, no thyromegaly, no lymphadenopathy. Lungs:Clear bilaterally, no wheezes, rhonci, crackles Cardiovascular: Regular rate and rhythm. No murmurs, gallops or rubs. Abdomen:Soft. Bowel sounds present. Non-tender.  Extremities: No lower extremity edema. Pulses are 2 + in the bilateral DP/PT.  EKG:NSR, rate 62 bpm. Non-specific T wave changes. Old inferior infarct.

## 2011-02-15 NOTE — Assessment & Plan Note (Signed)
Stable. Continue current meds.   

## 2011-02-22 ENCOUNTER — Telehealth: Payer: Self-pay | Admitting: Cardiovascular Disease

## 2011-02-22 NOTE — Telephone Encounter (Signed)
Message left on identified voicemail that Crestor 20 mg tablets would be at front desk to be picked up. Instructions left on voicemail for pt to take 2 tablets daily to total 40 mg.  Crestor 20 mg--56 tablets==Lot [id number], exp. 6/15 left at front desk.

## 2011-02-22 NOTE — Telephone Encounter (Signed)
Pt needs samples of crestor 40mg 

## 2011-03-02 ENCOUNTER — Other Ambulatory Visit: Payer: Self-pay | Admitting: Cardiology

## 2011-03-04 ENCOUNTER — Other Ambulatory Visit: Payer: Self-pay | Admitting: Cardiology

## 2011-03-04 NOTE — Telephone Encounter (Signed)
Pt's wife wants refill of Metoprolol succinate 50 mg hctz 25 mg   Please call to [facility] [address]

## 2011-03-05 ENCOUNTER — Other Ambulatory Visit: Payer: Self-pay

## 2011-03-05 MED ORDER — LISINOPRIL 40 MG PO TABS: 40.0000 mg | ORAL_TABLET | Freq: Every day | ORAL | Status: DC

## 2011-04-11 ENCOUNTER — Telehealth: Payer: Self-pay | Admitting: Cardiovascular Disease

## 2011-04-11 DIAGNOSIS — I2510 Atherosclerotic heart disease of native coronary artery without angina pectoris: Secondary | ICD-10-CM

## 2011-04-11 MED ORDER — CLOPIDOGREL BISULFATE 75 MG PO TABS: 75.0000 mg | ORAL_TABLET | Freq: Every day | ORAL | Status: DC

## 2011-04-11 MED ORDER — HYDROCHLOROTHIAZIDE 25 MG PO TABS: 25.0000 mg | ORAL_TABLET | Freq: Every day | ORAL | Status: DC

## 2011-04-11 MED ORDER — METOPROLOL SUCCINATE ER 50 MG PO TB24: 50.0000 mg | ORAL_TABLET | Freq: Every day | ORAL | Status: DC

## 2011-04-11 NOTE — Telephone Encounter (Signed)
New msg Pt's wife wants refill metoprolol, hydrocholorthiazide, [facility] [address]  He wants samples of crestor He usually gets plavix thru pt assistance Please call pt's wife back

## 2011-04-11 NOTE — Telephone Encounter (Signed)
Patient's wife was called back,samples of crestor 20 mg take 2 tablets every night left at front desk.Metoprolol and HCTZ sent in to [facility] [address]. Also generic Plavix sent in to [facility] [address]

## 2011-05-07 ENCOUNTER — Telehealth: Payer: Self-pay | Admitting: Cardiovascular Disease

## 2011-05-07 DIAGNOSIS — E785 Hyperlipidemia, unspecified: Secondary | ICD-10-CM

## 2011-05-07 MED ORDER — SIMVASTATIN 40 MG PO TABS: 40.0000 mg | ORAL_TABLET | Freq: Every evening | ORAL | Status: DC

## 2011-05-07 NOTE — Telephone Encounter (Signed)
Spoke with wife and told her we do not have samples of Crestor in office at this time.  She would like pt to try cheaper alternative to Crestor as pt's copay is high for Crestor.  She does not recall pt being on anything else.  Upon review of chart pt was changed to Simvastatin 40 mg at office visit with Dr. Juanda Chance on 03/09/09 but wife called office on 04/19/09 and stated that pt would like to stay on Crestor. Pt has been on Crestor since that time. Will review with Dr. Clifton James as to possible change to cheaper med.   Wife aware pt would need follow up blood work if medication changed.

## 2011-05-07 NOTE — Telephone Encounter (Signed)
WE can change to zocor 40mg  po QHS. Thanks, chris

## 2011-05-07 NOTE — Telephone Encounter (Signed)
Spoke with wife and gave her instructions for pt to stop Crestor and start simvastatin 40 mg by mouth daily. She is aware pt will need to come in for fasting lab work the first week in March.  [facility] [address]

## 2011-05-07 NOTE — Telephone Encounter (Signed)
New Msg: Pt wife calling wanting to know if pt can get samples of crestor 20 mg. Please return pt wife call to discuss further.

## 2011-07-02 ENCOUNTER — Other Ambulatory Visit: Payer: PRIVATE HEALTH INSURANCE

## 2011-07-09 ENCOUNTER — Other Ambulatory Visit: Payer: PRIVATE HEALTH INSURANCE

## 2011-07-10 ENCOUNTER — Other Ambulatory Visit (INDEPENDENT_AMBULATORY_CARE_PROVIDER_SITE_OTHER): Payer: PRIVATE HEALTH INSURANCE

## 2011-07-10 DIAGNOSIS — E785 Hyperlipidemia, unspecified: Secondary | ICD-10-CM

## 2011-07-10 LAB — HEPATIC FUNCTION PANEL
ALT: 30 U/L (ref 0–53)
AST: 28 U/L (ref 0–37)
Alkaline Phosphatase: 83 U/L (ref 39–117)
Bilirubin, Direct: 0 mg/dL (ref 0.0–0.3)
Total Protein: 7.7 g/dL (ref 6.0–8.3)

## 2011-07-10 LAB — LDL CHOLESTEROL, DIRECT: Direct LDL: 116.9 mg/dL

## 2011-07-17 ENCOUNTER — Other Ambulatory Visit: Payer: Self-pay | Admitting: *Deleted

## 2011-07-17 ENCOUNTER — Telehealth: Payer: Self-pay | Admitting: Cardiovascular Disease

## 2011-07-17 DIAGNOSIS — E785 Hyperlipidemia, unspecified: Secondary | ICD-10-CM

## 2011-07-17 MED ORDER — SIMVASTATIN 80 MG PO TABS: 80.0000 mg | ORAL_TABLET | Freq: Every evening | ORAL | Status: DC

## 2011-10-08 ENCOUNTER — Other Ambulatory Visit (INDEPENDENT_AMBULATORY_CARE_PROVIDER_SITE_OTHER): Payer: PRIVATE HEALTH INSURANCE

## 2011-10-08 DIAGNOSIS — E785 Hyperlipidemia, unspecified: Secondary | ICD-10-CM

## 2011-10-08 LAB — LIPID PANEL
Cholesterol: 279 mg/dL — ABNORMAL HIGH (ref 0–200)
Total CHOL/HDL Ratio: 6
VLDL: 44 mg/dL — ABNORMAL HIGH (ref 0.0–40.0)

## 2011-10-08 LAB — HEPATIC FUNCTION PANEL
AST: 30 U/L (ref 0–37)
Alkaline Phosphatase: 77 U/L (ref 39–117)
Bilirubin, Direct: 0 mg/dL (ref 0.0–0.3)
Total Bilirubin: 0.2 mg/dL — ABNORMAL LOW (ref 0.3–1.2)

## 2011-10-08 LAB — LDL CHOLESTEROL, DIRECT: Direct LDL: 193.8 mg/dL

## 2011-10-16 ENCOUNTER — Telehealth: Payer: Self-pay | Admitting: Cardiovascular Disease

## 2011-10-16 NOTE — Telephone Encounter (Signed)
Follow up on previous call:  Patient wife calling back to speak with nurse.

## 2011-10-16 NOTE — Telephone Encounter (Signed)
Spoke with pt's wife and reviewed lab results and recommendations from Dr. Clifton James

## 2012-01-15 ENCOUNTER — Encounter: Payer: Self-pay | Admitting: Cardiovascular Disease

## 2012-01-15 ENCOUNTER — Ambulatory Visit (INDEPENDENT_AMBULATORY_CARE_PROVIDER_SITE_OTHER): Payer: PRIVATE HEALTH INSURANCE | Admitting: Cardiovascular Disease

## 2012-01-15 VITALS — BP 150/90 | HR 54 | Ht 70.0 in | Wt 184.0 lb

## 2012-01-15 DIAGNOSIS — I10 Essential (primary) hypertension: Secondary | ICD-10-CM

## 2012-01-15 DIAGNOSIS — E785 Hyperlipidemia, unspecified: Secondary | ICD-10-CM

## 2012-01-15 DIAGNOSIS — I2510 Atherosclerotic heart disease of native coronary artery without angina pectoris: Secondary | ICD-10-CM

## 2012-01-15 LAB — BASIC METABOLIC PANEL
Calcium: 9.3 mg/dL (ref 8.4–10.5)
GFR: 87.18 mL/min (ref >60.00)
Glucose, Bld: 111 mg/dL — ABNORMAL HIGH (ref 70–99)
Sodium: 137 mEq/L (ref 135–145)

## 2012-01-15 LAB — LIPID PANEL
Cholesterol: 235 mg/dL — ABNORMAL HIGH (ref 0–200)
Triglycerides: 213 mg/dL — ABNORMAL HIGH (ref 0.0–149.0)

## 2012-01-15 LAB — HEPATIC FUNCTION PANEL
ALT: 25 U/L (ref 0–53)
AST: 33 U/L (ref 0–37)
Total Bilirubin: 0.5 mg/dL (ref 0.3–1.2)
Total Protein: 7.6 g/dL (ref 6.0–8.3)

## 2012-01-15 NOTE — Patient Instructions (Addendum)
Your physician wants you to follow-up in:  6 months. You will receive a reminder letter in the mail two months in advance. If you don't receive a letter, please call our office to schedule the follow-up appointment.  Check blood pressure at home and keep record.  Let us know if readings remain elevated  Your physician has recommended you make the following change in your medication: Stop Plavix

## 2012-01-15 NOTE — Progress Notes (Signed)
History of Present Illness: 61 yo AAM with history of CAD, HTN, HLD here today for cardiac follow up. He has been followed in the past by Dr. Juanda Chance. 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 His lipids from June 2013 were uncontrolled with LDL of 193. He had not been taking his Zocor. He restarted at our request.   He has been doing well. No chest pain, SOB or palpitations. He has been taking all meds. He drinks 3 vodka drinks per night.   Primary Care Physician: None  Last Lipid Profile:Lipid Panel     Component Value Date/Time   CHOL 279* 10/08/2011 0908   TRIG 220.0* 10/08/2011 0908   HDL 47.00 10/08/2011 0908   CHOLHDL 6 10/08/2011 0908   VLDL 44.0* 10/08/2011 0908   LDLCALC 193 10/08/2011     Past Medical History  Diagnosis Date  . CAD (coronary artery disease)   . Hyperlipidemia   . Hypertension   . ST elevation myocardial infarction (STEMI) of true posterior wall     No past surgical history on file.  Current Outpatient Prescriptions  Medication Sig Dispense Refill  . aspirin 81 MG tablet Take 1 tablet (81 mg total) by mouth daily.  30 tablet  0  . clopidogrel (PLAVIX) 75 MG tablet Take 1 tablet (75 mg total) by mouth daily.  90 tablet  3  . hydrochlorothiazide (HYDRODIURIL) 25 MG tablet Take 1 tablet (25 mg total) by mouth daily.  30 tablet  6  . ibuprofen (ADVIL,MOTRIN) 200 MG tablet Take 200 mg by mouth 2 (two) times daily.        Marland Kitchen lisinopril (PRINIVIL,ZESTRIL) 40 MG tablet Take 1 tablet (40 mg total) by mouth daily.  30 tablet  10  . metoprolol (TOPROL-XL) 50 MG 24 hr tablet Take 1 tablet (50 mg total) by mouth daily.  30 tablet  6  . nitroGLYCERIN (NITROSTAT) 0.4 MG SL tablet Place 0.4 mg under the tongue every 5 (five) minutes as needed. Take 1 tablet under tongue at onset of chest pain; you may repeat every 5 minutes for up to 3 doses.       . simvastatin (ZOCOR)  80 MG tablet Take 1 tablet (80 mg total) by mouth every evening.  30 tablet  11    No Known Allergies  History   Social History  . Marital Status: Single    Spouse Name: N/A    Number of Children: N/A  . Years of Education: N/A   Occupational History  . Drives delivery truck    Social History Main Topics  . Smoking status: Former Games developer  . Smokeless tobacco: Not on file  . Alcohol Use: 0.6 oz/week    1 Shots of liquor per week  . Drug Use: No     denies  . Sexually Active: Not on file   Other Topics Concern  . Not on file   Social History Narrative    He lives in [place] with his wife.  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  He has a 40-pack-year history of tobacco use but has not  had a cigarette in the last few days.  He drinks a pine of vodka a day.  He denies drug use    Family History  Problem Relation Age of Onset  . Cancer Father 37  . Coronary artery disease  siblings    Review of Systems:  As stated in the HPI and otherwise negative.   BP 150/90  Pulse 54  Ht 5\' 10"  (1.778 m)  Wt 184 lb (83.462 kg)  BMI 26.40 kg/m2  Physical Examination: General: Well developed, well nourished, NAD HEENT: OP clear, mucus membranes moist SKIN: warm, dry. No rashes. Neuro: No focal deficits Musculoskeletal: Muscle strength 5/5 all ext Psychiatric: Mood and affect normal Neck: No JVD, no carotid bruits, no thyromegaly, no lymphadenopathy. Lungs:Clear bilaterally, no wheezes, rhonci, crackles Cardiovascular: Huston Foley, regular  rhythm. No murmurs, gallops or rubs. Abdomen:Soft. Bowel sounds present. Non-tender.  Extremities: No lower extremity edema. Pulses are 2 + in the bilateral DP/PT.  EKG: Sinus brady, rate 54 bpm. LAD. Non-specific T wave abnormality.   Assessment and Plan:   1. CAD: He is doing well. He is now 3 years s/p DES placement RCA. Will continue ASA. Will d/c Plavix. Will continue beta blocker, statin, Ace-inh.   2. HTN: Slightly elevated today. He is  asked to check his BP at home and let us know if it is running above 140. Will continue HCTZ, beta blocker, Lisinopril. Cannot add Norvasc since he is on Zocor at a high dose. If his BP remains elevated, will need to add Hydralazine since he is bradycardic and has no room for titration of his beta blocker. Check BMET today.   3. Hyperlipidemia: Continue statin. Will check fasting lipids and LFTs today.

## 2012-01-17 ENCOUNTER — Other Ambulatory Visit: Payer: Self-pay | Admitting: *Deleted

## 2012-01-17 DIAGNOSIS — E7800 Pure hypercholesterolemia, unspecified: Secondary | ICD-10-CM

## 2012-01-17 MED ORDER — ATORVASTATIN CALCIUM 80 MG PO TABS: 80.0000 mg | ORAL_TABLET | Freq: Every day | ORAL | Status: DC

## 2012-03-10 ENCOUNTER — Other Ambulatory Visit: Payer: Self-pay | Admitting: Cardiovascular Disease

## 2012-04-08 ENCOUNTER — Other Ambulatory Visit (INDEPENDENT_AMBULATORY_CARE_PROVIDER_SITE_OTHER): Payer: PRIVATE HEALTH INSURANCE

## 2012-04-08 DIAGNOSIS — E7800 Pure hypercholesterolemia, unspecified: Secondary | ICD-10-CM

## 2012-04-08 LAB — HEPATIC FUNCTION PANEL
ALT: 25 U/L (ref 0–53)
AST: 28 U/L (ref 0–37)
Bilirubin, Direct: 0.1 mg/dL (ref 0.0–0.3)
Total Bilirubin: 0.7 mg/dL (ref 0.3–1.2)

## 2012-04-08 LAB — LIPID PANEL: Total CHOL/HDL Ratio: 6

## 2012-04-10 ENCOUNTER — Telehealth: Payer: Self-pay | Admitting: Cardiovascular Disease

## 2012-04-10 NOTE — Telephone Encounter (Signed)
Follow-up: ° ° ° ° °Patient returned Pat's call.  Please call back. °

## 2012-04-10 NOTE — Telephone Encounter (Signed)
I spoke with pt and he is not taking his lipitor everyday. Reviewed labs with him and encouraged him to take his medication as directed. He agrees with this. Mylo Red RN

## 2012-09-17 ENCOUNTER — Other Ambulatory Visit: Payer: Self-pay | Admitting: Cardiovascular Disease

## 2012-12-02 ENCOUNTER — Other Ambulatory Visit: Payer: Self-pay | Admitting: Cardiovascular Disease

## 2013-01-04 ENCOUNTER — Other Ambulatory Visit: Payer: Self-pay | Admitting: Cardiovascular Disease

## 2013-01-30 ENCOUNTER — Other Ambulatory Visit: Payer: Self-pay | Admitting: Cardiovascular Disease

## 2013-02-17 ENCOUNTER — Other Ambulatory Visit: Payer: Self-pay | Admitting: Cardiovascular Disease

## 2013-02-24 ENCOUNTER — Encounter: Payer: Self-pay | Admitting: Physician Assistant

## 2013-02-24 ENCOUNTER — Ambulatory Visit (INDEPENDENT_AMBULATORY_CARE_PROVIDER_SITE_OTHER): Payer: PRIVATE HEALTH INSURANCE | Admitting: Physician Assistant

## 2013-02-24 VITALS — BP 140/100 | HR 57 | Ht 70.0 in | Wt 181.0 lb

## 2013-02-24 DIAGNOSIS — I2510 Atherosclerotic heart disease of native coronary artery without angina pectoris: Secondary | ICD-10-CM

## 2013-02-24 DIAGNOSIS — I10 Essential (primary) hypertension: Secondary | ICD-10-CM

## 2013-02-24 DIAGNOSIS — E785 Hyperlipidemia, unspecified: Secondary | ICD-10-CM

## 2013-02-24 LAB — BASIC METABOLIC PANEL
BUN: 14 mg/dL (ref 6–23)
CO2: 29 mEq/L (ref 19–32)
Calcium: 10 mg/dL (ref 8.4–10.5)
Chloride: 101 mEq/L (ref 96–112)
Potassium: 3.6 mEq/L (ref 3.5–5.1)
Sodium: 137 mEq/L (ref 135–145)

## 2013-02-24 LAB — HEPATIC FUNCTION PANEL
ALT: 26 U/L (ref 0–53)
AST: 29 U/L (ref 0–37)
Albumin: 4 g/dL (ref 3.5–5.2)
Alkaline Phosphatase: 99 U/L (ref 39–117)
Bilirubin, Direct: 0 mg/dL (ref 0.0–0.3)
Total Protein: 7.8 g/dL (ref 6.0–8.3)

## 2013-02-24 LAB — LDL CHOLESTEROL, DIRECT: Direct LDL: 110 mg/dL

## 2013-02-24 LAB — LIPID PANEL
Cholesterol: 196 mg/dL (ref 0–200)
HDL: 40 mg/dL (ref >39.00)

## 2013-02-24 MED ORDER — AMLODIPINE BESYLATE 5 MG PO TABS: 5.0000 mg | ORAL_TABLET | Freq: Every day | ORAL | Status: DC

## 2013-02-24 NOTE — Patient Instructions (Signed)
START AMLODIPINE 5 MG DAILY  LAB TODAY BMET, FASTING LIPID AND LIVER PANEL TODAY  PLEASE FOLLOW UP WITH SCOTT WEAVER, P-AC 03/29/13 @ 8:50 AM

## 2013-02-24 NOTE — Progress Notes (Signed)
[address] [place], [place]  [address] Phone: [phone] Fax:  [phone]  Date:  02/24/2013   ID:  Gabriel Becker, DOB 03-May-1950, MRN [id number]  PCP:  No primary provider on file.  Cardiologist:  Dr. Verne Carrow    History of Present Illness: Gabriel Becker is a 62 y.o. male who returns for followup.  He has a history of CAD, status post inferior MI treated with DES to the RCA in 01/2009, HTN, HL.  Last seen by Dr. Verne Carrow 12/2011.    The patient denies chest pain, shortness of breath, syncope, orthopnea, PND or significant pedal edema.   Labs (9/13):   K 3.8, Creatinine 1.1  Labs (12/13): ALT 25, 39, LDL 180  Wt Readings from Last 3 Encounters:  01/15/12 184 lb (83.462 kg)  02/15/11 179 lb (81.194 kg)  02/12/10 184 lb (83.462 kg)     Past Medical History  Diagnosis Date  . Hyperlipidemia   . Hypertension   . ST elevation myocardial infarction (STEMI) of true posterior wall   . CAD (coronary artery disease)     a. inf STEMI 01/2009 => LHC - dRCA occl (tx with Xience 2.8x18 mm DES), pLAD 30, PL1 70, PL2 80, EF 55%    Current Outpatient Prescriptions  Medication Sig Dispense Refill  . aspirin 81 MG tablet Take 1 tablet (81 mg total) by mouth daily.  30 tablet  0  . atorvastatin (LIPITOR) 80 MG tablet TAKE 1 TABLET (80 MG TOTAL) BY MOUTH DAILY.  30 tablet  6  . hydrochlorothiazide (HYDRODIURIL) 25 MG tablet TAKE 1 TABLET (25 MG TOTAL) BY MOUTH DAILY.  30 tablet  3  . ibuprofen (ADVIL,MOTRIN) 200 MG tablet Take 200 mg by mouth 2 (two) times daily.        Marland Kitchen lisinopril (PRINIVIL,ZESTRIL) 40 MG tablet TAKE 1 TABLET (40 MG TOTAL) BY MOUTH DAILY.  30 tablet  1  . lisinopril (PRINIVIL,ZESTRIL) 40 MG tablet TAKE 1 TABLET (40 MG TOTAL) BY MOUTH DAILY.  30 tablet  0  . metoprolol succinate (TOPROL-XL) 50 MG 24 hr tablet TAKE 1 TABLET (50 MG TOTAL) BY MOUTH DAILY.  30 tablet  3  . nitroGLYCERIN (NITROSTAT) 0.4 MG SL tablet Place 0.4 mg under the  tongue every 5 (five) minutes as needed. Take 1 tablet under tongue at onset of chest pain; you may repeat every 5 minutes for up to 3 doses.        No current facility-administered medications for this visit.    Allergies:   No Known Allergies  Social History:  The patient  reports that he has quit smoking. He does not have any smokeless tobacco history on file. He reports that he drinks about 0.6 ounces of alcohol per week. He reports that he does not use illicit drugs.   Family History:  The patient's family history includes Cancer (age of onset: 48) in his father; Coronary artery disease in an other family member.   ROS:  Please see the history of present illness.      All other systems reviewed and negative.   PHYSICAL EXAM: VS:  BP 140/100  Pulse 57  Ht 5\' 10"  (1.778 m)  Wt 181 lb (82.101 kg)  BMI 25.97 kg/m2 Well nourished, well developed, in no acute distress HEENT: normal Neck: no JVD Vascular: No carotid bruits Cardiac:  normal S1, S2; RRR; no murmur Lungs:  clear to auscultation bilaterally, no wheezing, rhonchi or rales Abd: soft, nontender,  no hepatomegaly Ext: no edema Skin: warm and dry Neuro:  CNs 2-12 intact, no focal abnormalities noted  EKG:  NSR, HR 57, normal axis, nonspecific ST-T wave changes     ASSESSMENT AND PLAN:  1. CAD:  No angina. Continue aspirin, statin. 2. Hypertension:  Uncontrolled. Add Amlodipine 5 mg daily. Check basic metabolic panel. 3. Hyperlipidemia:  Continue Lipitor. Check lipids and LFTs. 4. Disposition:  Follow up with me in one month.  Signed, Tereso Newcomer, PA-C  02/24/2013 9:17 AM

## 2013-03-12 ENCOUNTER — Telehealth: Payer: Self-pay | Admitting: *Deleted

## 2013-03-12 MED ORDER — ROSUVASTATIN CALCIUM 40 MG PO TABS: 40.0000 mg | ORAL_TABLET | Freq: Every day | ORAL | Status: DC

## 2013-03-12 NOTE — Telephone Encounter (Signed)
I spoke with pt wife about lab results. He will finish out his bottle of Atorvastatin before starting Crestor 40mg . Pt has appt to see Lorin Picket already for March 29, 2014.  Mylo Red RN

## 2013-03-12 NOTE — Telephone Encounter (Signed)
lmtcb Debbie Keir Foland RN  

## 2013-03-12 NOTE — Telephone Encounter (Signed)
Message copied by Barrie Folk on Fri Mar 12, 2013 12:16 PM ------      Message from: Lisabeth Devoid F      Created: Thu Feb 25, 2013  2:24 PM                   ----- Message -----         From: Beatrice Lecher, PA-C         Sent: 02/24/2013  10:07 PM           To: [facility] Triage            K+ and creatinine ok      LFTs ok      LDL better but still too high      D/c lipitor      Start Crestor 40 QD      Check Lipids and LFTs in 8 weeks      Tereso Newcomer, PA-C        02/24/2013 10:07 PM ------

## 2013-03-29 ENCOUNTER — Ambulatory Visit (INDEPENDENT_AMBULATORY_CARE_PROVIDER_SITE_OTHER): Payer: PRIVATE HEALTH INSURANCE | Admitting: Physician Assistant

## 2013-03-29 ENCOUNTER — Encounter: Payer: Self-pay | Admitting: Physician Assistant

## 2013-03-29 VITALS — BP 174/110 | HR 61 | Ht 70.0 in | Wt 187.0 lb

## 2013-03-29 DIAGNOSIS — I2510 Atherosclerotic heart disease of native coronary artery without angina pectoris: Secondary | ICD-10-CM

## 2013-03-29 DIAGNOSIS — E785 Hyperlipidemia, unspecified: Secondary | ICD-10-CM

## 2013-03-29 DIAGNOSIS — I10 Essential (primary) hypertension: Secondary | ICD-10-CM

## 2013-03-29 MED ORDER — METOPROLOL SUCCINATE ER 50 MG PO TB24: 50.0000 mg | ORAL_TABLET | Freq: Every day | ORAL | Status: DC

## 2013-03-29 NOTE — Patient Instructions (Addendum)
Your physician has recommended you make the following change in your medication:   1. Start back taking Toprol XL 50mg  1 tablet by mouth daily.  2. Continue all other medications.  Your physician recommends that you schedule a follow-up appointment in: 1-2 weeks with Tereso Newcomer, P.A.  Refer to PCP for probable lipo on right buttock and to establish.

## 2013-03-29 NOTE — Progress Notes (Signed)
[address] [place], [place]  [address] Phone: [phone] Fax:  [phone]  Date:  03/29/2013   ID:  Gabriel Becker, DOB 1951/03/18, MRN [id number]  PCP:  No primary provider on file.  Cardiologist:  Dr. Verne Carrow    History of Present Illness: Gabriel Becker is a 62 y.o. male with a history of CAD, status post inferior MI treated with DES to the RCA in 01/2009, HTN, HL.  I saw him in f/u 02/24/13.  His BP was uncontrolled and I adjusted his medications.  He returns for f/u.  He misunderstood his directions and stopped taking Toprol XL.  His BP is even higher today.  The patient denies chest pain, shortness of breath, syncope, orthopnea, PND or significant pedal edema.    Recent Labs:  02/24/2013: ALT 26; Creatinine 1.0; Direct LDL 110.0; HDL 40.00; Potassium 3.6   Wt Readings from Last 3 Encounters:  03/29/13 187 lb (84.823 kg)  02/24/13 181 lb (82.101 kg)  01/15/12 184 lb (83.462 kg)     Past Medical History  Diagnosis Date  . Hyperlipidemia   . Hypertension   . ST elevation myocardial infarction (STEMI) of true posterior wall   . CAD (coronary artery disease)     a. inf STEMI 01/2009 => LHC - dRCA occl (tx with Xience 2.8x18 mm DES), pLAD 30, PL1 70, PL2 80, EF 55%    Current Outpatient Prescriptions  Medication Sig Dispense Refill  . amLODipine (NORVASC) 5 MG tablet Take 1 tablet (5 mg total) by mouth daily.  30 tablet  11  . aspirin 81 MG tablet Take 1 tablet (81 mg total) by mouth daily.  30 tablet  0  . atorvastatin (LIPITOR) 80 MG tablet Take 80 mg by mouth daily.      Marland Kitchen ibuprofen (ADVIL,MOTRIN) 200 MG tablet Take 200 mg by mouth 2 (two) times daily.        Marland Kitchen lisinopril (PRINIVIL,ZESTRIL) 40 MG tablet TAKE 1 TABLET (40 MG TOTAL) BY MOUTH DAILY.  30 tablet  1  . Multiple Vitamins-Minerals (MULTIVITAMIN WITH MINERALS) tablet Take 1 tablet by mouth daily.      . nitroGLYCERIN (NITROSTAT) 0.4 MG SL tablet Place 0.4 mg under the tongue every 5  (five) minutes as needed. Take 1 tablet under tongue at onset of chest pain; you may repeat every 5 minutes for up to 3 doses.        No current facility-administered medications for this visit.    Allergies:   No Known Allergies  Social History:  The patient  reports that he has quit smoking. He does not have any smokeless tobacco history on file. He reports that he drinks about 0.6 ounces of alcohol per week. He reports that he does not use illicit drugs.   Family History:  The patient's family history includes Cancer (age of onset: 22) in his father; Coronary artery disease in an other family member.   ROS:  Please see the history of present illness.  All other systems reviewed and negative.   PHYSICAL EXAM: VS:  BP 174/110  Pulse 61  Ht 5\' 10"  (1.778 m)  Wt 187 lb (84.823 kg)  BMI 26.83 kg/m2  SpO2 98% Well nourished, well developed, in no acute distress HEENT: normal Neck: no JVD Cardiac:  normal S1, S2; RRR; no murmur Lungs:  clear to auscultation bilaterally, no wheezing, rhonchi or rales Abd: soft, nontender, no hepatomegaly Ext: no edema; ? Medium sized lipoma right buttock Skin:  warm and dry Neuro:  CNs 2-12 intact, no focal abnormalities noted  EKG:   NSR, HR 61, LAD, lateral T wave inversions, no change from prior tracing  ASSESSMENT AND PLAN:  1. CAD:  No angina. Continue aspirin, statin. 2. Hypertension:  Uncontrolled.  Continue Amlodipine, Lisinopril.  Restart Toprol XL 50 mg QD.  He admits to missing his medications sometimes.  I had a long d/w him regarding the importance of managing his BP appropriately.   3. Hyperlipidemia:  Recent LDL still high.  Original plan was to change him to Crestor.  I am concerned about his compliance.  Will continue Atorvastatin for now.  Consider adjusting cholesterol medication in the future.    4. Disposition:  Follow up with me in 1-2 weeks.  He has a probable lipoma on his right buttock.  Refer to primary care.    Signed, Tereso Newcomer, PA-C  03/29/2013 9:29 AM

## 2013-03-30 ENCOUNTER — Telehealth: Payer: Self-pay | Admitting: *Deleted

## 2013-03-30 ENCOUNTER — Other Ambulatory Visit: Payer: Self-pay | Admitting: *Deleted

## 2013-03-30 MED ORDER — LISINOPRIL 40 MG PO TABS: ORAL_TABLET | ORAL | Status: DC

## 2013-03-30 NOTE — Telephone Encounter (Signed)
Was he taking Metoprolol already?  He told me that he had stopped it. If he had not stopped it and he was still taking it, I need to change his medications. Tereso Newcomer, PA-C   03/30/2013 10:45 AM

## 2013-03-30 NOTE — Telephone Encounter (Signed)
He was not taking metoprolol prior to visit, d/c instructions listed Toprol, what she picked up medication at pharmacy it was metoprolol, the names confused her.

## 2013-03-30 NOTE — Telephone Encounter (Signed)
Ok thanks [facility], [place]   03/30/2013 4:45 PM

## 2013-03-30 NOTE — Telephone Encounter (Signed)
Wife called in to confirm metoprolol and toprol are the same medication, also wants [place] to know he drinks about 1 pint liquor a day. Patient is to return to see Tereso Newcomer in 2-3 weeks.

## 2013-04-12 ENCOUNTER — Other Ambulatory Visit: Payer: Self-pay | Admitting: Cardiovascular Disease

## 2013-07-08 ENCOUNTER — Other Ambulatory Visit: Payer: Self-pay | Admitting: Cardiovascular Disease

## 2013-08-07 ENCOUNTER — Other Ambulatory Visit: Payer: Self-pay | Admitting: Cardiovascular Disease

## 2013-08-10 ENCOUNTER — Telehealth: Payer: Self-pay | Admitting: *Deleted

## 2013-08-10 MED ORDER — ATORVASTATIN CALCIUM 80 MG PO TABS: ORAL_TABLET | ORAL | Status: DC

## 2013-08-10 MED ORDER — LISINOPRIL 40 MG PO TABS: ORAL_TABLET | ORAL | Status: DC

## 2013-08-10 NOTE — Telephone Encounter (Signed)
Received call from check out that pt's wife was in office to schedule appt for pt and is requesting refills of his medications. Chart reviewed and pt needs refills of atorvastatin and lisinopril.  [facility] [address]  Appt has been made for pt to see Tereso NewcomerScott Weaver, PA on August 26, 2013.  Wife aware pt will need to keep this appt.

## 2013-08-26 ENCOUNTER — Ambulatory Visit (INDEPENDENT_AMBULATORY_CARE_PROVIDER_SITE_OTHER): Payer: PRIVATE HEALTH INSURANCE | Admitting: Physician Assistant

## 2013-08-26 ENCOUNTER — Ambulatory Visit (INDEPENDENT_AMBULATORY_CARE_PROVIDER_SITE_OTHER)
Admission: RE | Admit: 2013-08-26 | Discharge: 2013-08-26 | Disposition: A | Discharge status: Home / Self Care | Payer: PRIVATE HEALTH INSURANCE | Source: Ambulatory Visit | Attending: Physician Assistant | Admitting: Physician Assistant

## 2013-08-26 ENCOUNTER — Encounter (INDEPENDENT_AMBULATORY_CARE_PROVIDER_SITE_OTHER): Payer: Self-pay

## 2013-08-26 ENCOUNTER — Encounter: Payer: Self-pay | Admitting: Physician Assistant

## 2013-08-26 VITALS — BP 142/90 | HR 57 | Ht 70.0 in | Wt 175.0 lb

## 2013-08-26 DIAGNOSIS — I2510 Atherosclerotic heart disease of native coronary artery without angina pectoris: Secondary | ICD-10-CM

## 2013-08-26 DIAGNOSIS — E785 Hyperlipidemia, unspecified: Secondary | ICD-10-CM

## 2013-08-26 DIAGNOSIS — R222 Localized swelling, mass and lump, trunk: Secondary | ICD-10-CM

## 2013-08-26 DIAGNOSIS — I10 Essential (primary) hypertension: Secondary | ICD-10-CM

## 2013-08-26 LAB — HEPATIC FUNCTION PANEL
ALK PHOS: 116 U/L (ref 39–117)
ALT: 30 U/L (ref 0–53)
AST: 33 U/L (ref 0–37)
Albumin: 4.2 g/dL (ref 3.5–5.2)
BILIRUBIN TOTAL: 1.1 mg/dL (ref 0.3–1.2)
Bilirubin, Direct: 0.1 mg/dL (ref 0.0–0.3)
TOTAL PROTEIN: 8 g/dL (ref 6.0–8.3)

## 2013-08-26 LAB — BASIC METABOLIC PANEL
BUN: 14 mg/dL (ref 6–23)
CALCIUM: 9.4 mg/dL (ref 8.4–10.5)
CO2: 28 mEq/L (ref 19–32)
CREATININE: 0.8 mg/dL (ref 0.4–1.5)
Chloride: 103 mEq/L (ref 96–112)
GFR: 123.46 mL/min (ref >60.00)
GLUCOSE: 92 mg/dL (ref 70–99)
Potassium: 3.9 mEq/L (ref 3.5–5.1)
Sodium: 138 mEq/L (ref 135–145)

## 2013-08-26 LAB — LIPID PANEL
CHOLESTEROL: 190 mg/dL (ref 0–200)
HDL: 46.7 mg/dL (ref >39.00)
LDL Cholesterol: 110 mg/dL — ABNORMAL HIGH (ref 0–99)
Total CHOL/HDL Ratio: 4
Triglycerides: 169 mg/dL — ABNORMAL HIGH (ref 0.0–149.0)
VLDL: 33.8 mg/dL (ref 0.0–40.0)

## 2013-08-26 MED ORDER — AMLODIPINE BESYLATE 10 MG PO TABS: 10.0000 mg | ORAL_TABLET | Freq: Every day | ORAL | Status: DC

## 2013-08-26 NOTE — Progress Notes (Signed)
[address] [place], [place]  [address] Phone: [phone] Fax:  [phone]  Date:  08/26/2013   ID:  Gabriel MisCarl Becker, DOB 02-07-1951, MRN [id number]  PCP:  No primary provider on file.  Cardiologist:  Dr. Verne Carrowhristopher McAlhany    History of Present Illness: Gabriel MisCarl Larch is a 63 y.o. male with a history of CAD, status post inferior MI treated with DES to the RCA in 01/2009, HTN, HL.  I saw him last in 03/2013. His blood pressure is uncontrolled. I adjusted his medications and was to see him back in 2 weeks.  He returns for followup.  The patient denies chest pain, shortness of breath, syncope, orthopnea, PND or significant pedal edema.   Recent Labs:  02/24/2013: ALT 26; Creatinine 1.0; Direct LDL 110.0; HDL Cholesterol 40.00; Potassium 3.6   Wt Readings from Last 3 Encounters:  08/26/13 175 lb (79.379 kg)  03/29/13 187 lb (84.823 kg)  02/24/13 181 lb (82.101 kg)     Past Medical History  Diagnosis Date  . Hyperlipidemia   . Hypertension   . ST elevation myocardial infarction (STEMI) of true posterior wall   . CAD (coronary artery disease)     a. inf STEMI 01/2009 => LHC - dRCA occl (tx with Xience 2.8x18 mm DES), pLAD 30, PL1 70, PL2 80, EF 55%    Current Outpatient Prescriptions  Medication Sig Dispense Refill  . amLODipine (NORVASC) 5 MG tablet Take 1 tablet (5 mg total) by mouth daily.  30 tablet  11  . aspirin 81 MG tablet Take 1 tablet (81 mg total) by mouth daily.  30 tablet  0  . atorvastatin (LIPITOR) 80 MG tablet TAKE 1 TABLET (80 MG TOTAL) BY MOUTH DAILY.  30 tablet  1  . ibuprofen (ADVIL,MOTRIN) 200 MG tablet Take 200 mg by mouth 2 (two) times daily.        Marland Kitchen. lisinopril (PRINIVIL,ZESTRIL) 40 MG tablet TAKE 1 TABLET (40 MG TOTAL) BY MOUTH DAILY.  30 tablet  1  . metoprolol succinate (TOPROL-XL) 50 MG 24 hr tablet Take 1 tablet (50 mg total) by mouth daily. Take with or immediately following a meal.  30 tablet  6  . Multiple Vitamins-Minerals  (MULTIVITAMIN WITH MINERALS) tablet Take 1 tablet by mouth daily.      . nitroGLYCERIN (NITROSTAT) 0.4 MG SL tablet Place 0.4 mg under the tongue every 5 (five) minutes as needed. Take 1 tablet under tongue at onset of chest pain; you may repeat every 5 minutes for up to 3 doses.        No current facility-administered medications for this visit.    Allergies:   No Known Allergies  Social History:  The patient  reports that he has quit smoking. He does not have any smokeless tobacco history on file. He reports that he drinks about .6 ounces of alcohol per week. He reports that he does not use illicit drugs.   Family History:  The patient's family history includes Cancer (age of onset: 5672) in his father; Coronary artery disease in an other family member.   ROS:  Please see the history of present illness. He has a mass on his L chest that has been present for years.  All other systems reviewed and negative.   PHYSICAL EXAM: VS:  BP 142/90  Pulse 57  Ht 5\' 10"  (1.778 m)  Wt 175 lb (79.379 kg)  BMI 25.11 kg/m2 Well nourished, well developed, in no acute distress HEENT: normal  Neck: no JVD Vascular: No carotid bruits Chest:  Mild to mod sized hard mass (boney) around rib 3-4 on L at junction with sternum - nontender Cardiac:  normal S1, S2; RRR; no murmur Lungs:  clear to auscultation bilaterally, no wheezing, rhonchi or rales Abd: soft, nontender, no hepatomegaly Ext: no edema  Skin: warm and dry Neuro:  CNs 2-12 intact, no focal abnormalities noted  EKG:   Sinus bradycardia, HR 57, normal axis, nonspecific ST-T wave changes   ASSESSMENT AND PLAN:  1. CAD:  No angina. Continue aspirin, statin.  He has not had assessment for ischemia since his MI in 2010. I will arrange a plain exercise treadmill test. 2. Hypertension:  Better controlled. Increase amlodipine to 10 mg daily. Check a basic metabolic panel today.  3. Hyperlipidemia:  Continue statin. Check lipids and LFTs. 4. Chest  mass: Etiology is not certain. It feels bony. Question if this is an old fracture that is healed. Obtain chest x-ray.   5. Disposition:  Follow up with Dr. Verne Carrowhristopher McAlhany in 6 months. Refer to primary care.   Signed, Tereso NewcomerScott Nichele Slawson, PA-C  08/26/2013 2:51 PM

## 2013-08-26 NOTE — Patient Instructions (Addendum)
INCREASE AMLODIPINE TO 10 MG DAILY; NEW RX WAS SENT IN TODAY  LAB WORK TODAY; BMET, FASTING LIPID AND LIVER PANEL  Your physician has requested that you have an exercise tolerance test. For further information please visit [email or web address]. Please also follow instruction sheet, as given.  A chest x-ray takes a picture of the organs and structures inside the chest, including the heart, lungs, and blood vessels. This test can show several things, including, whether the heart is enlarges; whether fluid is building up in the lungs; and whether pacemaker / defibrillator leads are still in place. YOU CAN HAVE THIS DONE AT THE [facility] ON [address] FROM [facility]  Your physician wants you to follow-up in: 6 MONTHS WITH DR. Germaine PomfretMCALHANAY.  You will receive a reminder letter in the mail two months in advance. If you don't receive a letter, please call our office to schedule the follow-up appointment.

## 2013-08-30 ENCOUNTER — Telehealth: Payer: Self-pay | Admitting: *Deleted

## 2013-08-30 DIAGNOSIS — I2510 Atherosclerotic heart disease of native coronary artery without angina pectoris: Secondary | ICD-10-CM

## 2013-08-30 DIAGNOSIS — E785 Hyperlipidemia, unspecified: Secondary | ICD-10-CM

## 2013-08-30 MED ORDER — ROSUVASTATIN CALCIUM 40 MG PO TABS: 40.0000 mg | ORAL_TABLET | Freq: Every day | ORAL | Status: DC

## 2013-08-30 NOTE — Telephone Encounter (Signed)
s/w pt's wife about lab results, advised to d/c lipitor a nd start crestor 40 qd rx [facility] [address] FLP/LFT 10/25/13. Wife asked if we could check glucose on pt. I said yes, but he really needs to est w/PCP, referral in and ready for scheduling. I said I will see if one of the schedulers can call and [facility] [address] and get an appt. Wife said thank you for my help.

## 2013-09-01 ENCOUNTER — Telehealth: Payer: Self-pay | Admitting: *Deleted

## 2013-09-01 NOTE — Telephone Encounter (Signed)
Patient requests crestor samples. I will place at the front desk for pick up. 

## 2013-09-02 ENCOUNTER — Telehealth: Payer: Self-pay | Admitting: Physician Assistant

## 2013-09-02 DIAGNOSIS — E785 Hyperlipidemia, unspecified: Secondary | ICD-10-CM

## 2013-09-02 MED ORDER — ATORVASTATIN CALCIUM 80 MG PO TABS: 80.0000 mg | ORAL_TABLET | Freq: Every day | ORAL | Status: DC

## 2013-09-02 NOTE — Telephone Encounter (Signed)
Patient's wife is calling, patient was given Crestor and he is unable to take that. It causes pain in the patients arm. Please call and advise

## 2013-09-02 NOTE — Telephone Encounter (Signed)
Ok.  Continue Lipitor 80. Refer to [facility] CylinderScott Tyquarius Paglia, [place]   09/02/2013 5:22 PM

## 2013-09-02 NOTE — Telephone Encounter (Signed)
s/w pt's wife in refernce to pt unable to take the crestor due to arm pain. I advised per Bing NeighborsScott W. PA to d/c crestor go back on lipitor 80, and we will refer to lipid clinic. Advised I will have our office call w/appt.

## 2013-09-02 NOTE — Telephone Encounter (Signed)
Patient's wife is calling, patient was given Crestor and he is unable to take that. It causes pain in the patients arm. Please call and advise °

## 2013-09-24 ENCOUNTER — Other Ambulatory Visit: Payer: Self-pay | Admitting: *Deleted

## 2013-09-24 DIAGNOSIS — I10 Essential (primary) hypertension: Secondary | ICD-10-CM

## 2013-09-24 DIAGNOSIS — E785 Hyperlipidemia, unspecified: Secondary | ICD-10-CM

## 2013-09-30 ENCOUNTER — Ambulatory Visit (INDEPENDENT_AMBULATORY_CARE_PROVIDER_SITE_OTHER): Payer: PRIVATE HEALTH INSURANCE | Admitting: Physician Assistant

## 2013-09-30 DIAGNOSIS — I10 Essential (primary) hypertension: Secondary | ICD-10-CM

## 2013-09-30 DIAGNOSIS — I2510 Atherosclerotic heart disease of native coronary artery without angina pectoris: Secondary | ICD-10-CM

## 2013-09-30 MED ORDER — HYDRALAZINE HCL 25 MG PO TABS: 25.0000 mg | ORAL_TABLET | Freq: Three times a day (TID) | ORAL | Status: DC

## 2013-09-30 NOTE — Progress Notes (Signed)
Exercise Treadmill Test  Pre-Exercise Testing Evaluation Rhythm: normal sinus  Rate: 72 bpm     Test  Exercise Tolerance Test Ordering MD: Melene Muller, MD  Interpreting MD: Tereso Newcomer, PA-C  Unique Test No: 1  Treadmill:  1  Indication for ETT: known ASHD  Contraindication to ETT: No   Stress Modality: exercise - treadmill  Cardiac Imaging Performed: non   Protocol: standard Bruce - maximal  Max BP:  200/127  Max MPHR (bpm):  157 85% MPR (bpm):  133  MPHR obtained (bpm):  136 % MPHR obtained:  86  Reached 85% MPHR (min:sec):  4:00 Total Exercise Time (min-sec):  4:00  Workload in METS:  5.8 Borg Scale: 15  Reason ETT Terminated:  exaggerated hypertensive response    ST Segment Analysis At Rest: normal ST segments - no evidence of significant ST depression With Exercise: non-specific ST changes  Other Information Arrhythmia:  No Angina during ETT:  absent (0) Quality of ETT:  diagnostic  ETT Interpretation:  normal - no evidence of ischemia by ST analysis  Comments: Fair exercise capacity. No chest pain. Exaggerated hypertensive BP response to exercise. No significant ST changes to suggest ischemia.   Recommendations: BP is out of control.  Baseline BP prior to exercise 166/98. Will add Hydralazine 25 mg TID. BP check with RN in 2 weeks. F/u with Dr. Verne Carrow as planned. Signed,  Tereso Newcomer, PA-C   09/30/2013 10:40 AM

## 2013-09-30 NOTE — Patient Instructions (Addendum)
Start Hydralazine 25 mg three times a day for blood pressure.  You will remain on all of your other medications.  Return to see the nurse in 2 weeks for a blood pressure check.

## 2013-10-07 ENCOUNTER — Telehealth: Payer: Self-pay | Admitting: *Deleted

## 2013-10-07 NOTE — Telephone Encounter (Signed)
Put crestor samples back in the closet that the patient never picked up from 09/01/13.

## 2013-10-14 ENCOUNTER — Ambulatory Visit (INDEPENDENT_AMBULATORY_CARE_PROVIDER_SITE_OTHER): Payer: PRIVATE HEALTH INSURANCE | Admitting: Physician Assistant

## 2013-10-14 VITALS — BP 152/86 | HR 67 | Ht 70.0 in | Wt 173.4 lb

## 2013-10-14 DIAGNOSIS — I10 Essential (primary) hypertension: Secondary | ICD-10-CM

## 2013-10-14 NOTE — Progress Notes (Signed)
BP 152/86  Pulse 67  Ht 5\' 10"  (1.778 m)  Wt 173 lb 6.4 oz (78.654 kg)  BMI 24.88 kg/m2  Increase Hydralazine to 50 mg 3 times a day. Tereso NewcomerScott Weaver, PA-C   10/14/2013 5:04 PM

## 2013-10-14 NOTE — Patient Instructions (Signed)
The current medical regimen is effective;  continue present plan and medications.  Please have fasting lab work 6/29 as scheduled.  See checkout to schedule with a primary care doctor.  Follow up as scheduled with Dr Clifton JamesMcAlhany.

## 2013-10-14 NOTE — Progress Notes (Signed)
Pt states he is taking all medications as listed - took them this morning and is doing well.  No complaints.

## 2013-10-25 ENCOUNTER — Other Ambulatory Visit (INDEPENDENT_AMBULATORY_CARE_PROVIDER_SITE_OTHER): Payer: PRIVATE HEALTH INSURANCE

## 2013-10-25 ENCOUNTER — Telehealth: Payer: Self-pay | Admitting: *Deleted

## 2013-10-25 DIAGNOSIS — E785 Hyperlipidemia, unspecified: Secondary | ICD-10-CM

## 2013-10-25 DIAGNOSIS — I2510 Atherosclerotic heart disease of native coronary artery without angina pectoris: Secondary | ICD-10-CM

## 2013-10-25 LAB — HEPATIC FUNCTION PANEL
ALT: 24 U/L (ref 0–53)
AST: 25 U/L (ref 0–37)
Albumin: 3.9 g/dL (ref 3.5–5.2)
Alkaline Phosphatase: 128 U/L — ABNORMAL HIGH (ref 39–117)
BILIRUBIN DIRECT: 0 mg/dL (ref 0.0–0.3)
TOTAL PROTEIN: 8.1 g/dL (ref 6.0–8.3)
Total Bilirubin: 0.3 mg/dL (ref 0.2–1.2)

## 2013-10-25 LAB — LIPID PANEL
CHOLESTEROL: 178 mg/dL (ref 0–200)
HDL: 42.6 mg/dL (ref >39.00)
LDL Cholesterol: 96 mg/dL (ref 0–99)
NonHDL: 135.4
Total CHOL/HDL Ratio: 4
Triglycerides: 198 mg/dL — ABNORMAL HIGH (ref 0.0–149.0)
VLDL: 39.6 mg/dL (ref 0.0–40.0)

## 2013-10-25 NOTE — Telephone Encounter (Signed)
pt notified about lab results and that he needs to f/u w/PCP about elevated ALP. Pt said no one has set him up yet w/PCP. I explained will have schedulers call him with appt date and time. Referral in system already.

## 2013-11-11 ENCOUNTER — Other Ambulatory Visit: Payer: Self-pay | Admitting: Cardiovascular Disease

## 2013-12-06 ENCOUNTER — Telehealth: Payer: Self-pay | Admitting: Physician Assistant

## 2013-12-06 NOTE — Telephone Encounter (Signed)
New problem   Pt want to know phone number to [facility] that [place] referred him to. Pt stated he hasn't received a call from them yet. Please call pt.

## 2013-12-06 NOTE — Telephone Encounter (Signed)
Did give wife Dr Erasmo LeventhalHunter's name and number

## 2013-12-27 ENCOUNTER — Other Ambulatory Visit: Payer: Self-pay | Admitting: Cardiovascular Disease

## 2013-12-28 ENCOUNTER — Other Ambulatory Visit: Payer: Self-pay | Admitting: Cardiovascular Disease

## 2014-01-05 ENCOUNTER — Other Ambulatory Visit: Payer: Self-pay | Admitting: Cardiovascular Disease

## 2014-01-07 ENCOUNTER — Other Ambulatory Visit: Payer: Self-pay | Admitting: Cardiovascular Disease

## 2014-03-09 ENCOUNTER — Other Ambulatory Visit: Payer: Self-pay | Admitting: Cardiovascular Disease

## 2014-04-11 ENCOUNTER — Ambulatory Visit (INDEPENDENT_AMBULATORY_CARE_PROVIDER_SITE_OTHER): Payer: PRIVATE HEALTH INSURANCE | Admitting: Family Medicine

## 2014-04-11 ENCOUNTER — Encounter: Payer: Self-pay | Admitting: Family Medicine

## 2014-04-11 VITALS — BP 150/80 | Temp 98.4°F | Wt 173.0 lb

## 2014-04-11 DIAGNOSIS — F32A Depression, unspecified: Secondary | ICD-10-CM | POA: Insufficient documentation

## 2014-04-11 DIAGNOSIS — M1990 Unspecified osteoarthritis, unspecified site: Secondary | ICD-10-CM | POA: Insufficient documentation

## 2014-04-11 DIAGNOSIS — Z87891 Personal history of nicotine dependence: Secondary | ICD-10-CM | POA: Insufficient documentation

## 2014-04-11 DIAGNOSIS — F329 Major depressive disorder, single episode, unspecified: Secondary | ICD-10-CM | POA: Insufficient documentation

## 2014-04-11 DIAGNOSIS — I10 Essential (primary) hypertension: Secondary | ICD-10-CM

## 2014-04-11 DIAGNOSIS — E785 Hyperlipidemia, unspecified: Secondary | ICD-10-CM

## 2014-04-11 DIAGNOSIS — I2510 Atherosclerotic heart disease of native coronary artery without angina pectoris: Secondary | ICD-10-CM

## 2014-04-11 DIAGNOSIS — F1010 Alcohol abuse, uncomplicated: Secondary | ICD-10-CM | POA: Insufficient documentation

## 2014-04-11 NOTE — Assessment & Plan Note (Signed)
Mild poor control. Advised alcohol cessation. Discussed would need to add hctz if does not improve. 2 month follow up.

## 2014-04-11 NOTE — Assessment & Plan Note (Signed)
Patient already with hepatomegaly on exam. We discussed elevated alkaline phosphatase likely due to liver damage. Advised alcohol cessation slowly to avoid withdrawal and plan AA follow up. Offered detox but patient declined. This could be contributing to elevated BP as well.

## 2014-04-11 NOTE — Assessment & Plan Note (Signed)
Well controlled with last LDL <100 though ideally would want less than 70. Continue atorvastatin. Advised regular exercise.

## 2014-04-11 NOTE — Assessment & Plan Note (Signed)
Asymptomatic. continue ASA, atorvastatin 80mg , metoprolol. Continue regular cardiology follow up.

## 2014-04-11 NOTE — Progress Notes (Signed)
Gabriel ConchStephen Bricyn Labrada, MD Phone: [phone]  Subjective:  Patient presents today to establish care. Chief complaint-noted.   CAD- stable Stent 2010 d/t inferior MI. ASA, atorvastatin 80mg , metoprolol tolerated well. no regular exercise though active as delivery driver with lifting and denies any chet pain or shortness of breath with this.  ROS- has sore gums and teeth with many loose teeth, has not seen a dentist in years and does not want to see his previous dentist  Hyperlipidemia-good control  Lab Results  Component Value Date   LDLCALC 96 10/25/2013  On statin: yes Regular exercise: no Diet: some poor choices ROS- no chest pain or shortness of breath. No myalgias  Hypertension-poor control  BP Readings from Last 3 Encounters:  04/11/14 150/80  10/14/13 152/86  08/26/13 142/90   Home BP monitoring-no Compliant with medications-yes without side effects ROS-Denies any CP, HA, SOB, blurry vision, LE edema.   Alcohol abuse Drinks a pint of smirnoff 80 proof every 2 nights. Interested in quitting.  ROS- no RUQ pain or nausea. Denies history of withdrawals  ROS--See above HPI , otherwise full ROS was completed and negative except as noted above  The following were reviewed and entered/updated in epic: Past Medical History  Diagnosis Date  . Hyperlipidemia   . Hypertension   . ST elevation myocardial infarction (STEMI) of true posterior wall   . CAD (coronary artery disease)     a. inf STEMI 01/2009 => LHC - dRCA occl (tx with Xience 2.8x18 mm DES), pLAD 30, PL1 70, PL2 80, EF 55%  . Alcohol abuse    Patient Active Problem List   Diagnosis Date Noted  . CAD (coronary artery disease) 03/07/2009    Priority: High  . Hyperlipidemia 03/07/2009    Priority: Medium  . Essential hypertension 03/07/2009    Priority: Medium  . Alcohol abuse 04/11/2014  . Arthritis 04/11/2014  . Former smoker 04/11/2014   Past Surgical History  Procedure Laterality Date  . None       Family History  Problem Relation Age of Onset  . Cancer Father 2472    unknown cancer  . Coronary artery disease      siblings  . Cancer Sister     x2, unknown  . CVA Mother [id number]    Medications- reviewed and updated Current Outpatient Prescriptions  Medication Sig Dispense Refill  . amLODipine (NORVASC) 10 MG tablet Take 1 tablet (10 mg total) by mouth daily. 30 tablet 11  . aspirin 81 MG tablet Take 1 tablet (81 mg total) by mouth daily. 30 tablet 0  . atorvastatin (LIPITOR) 80 MG tablet Take 1 tablet (80 mg total) by mouth daily. 30 tablet 11  . hydrALAZINE (APRESOLINE) 25 MG tablet Take 1 tablet (25 mg total) by mouth 3 (three) times daily. 90 tablet 11  . ibuprofen (ADVIL,MOTRIN) 200 MG tablet Take 200 mg by mouth 2 (two) times daily.      Marland Kitchen. lisinopril (PRINIVIL,ZESTRIL) 40 MG tablet TAKE 1 TABLET BY MOUTH EVERY DAY 30 tablet 1  . metoprolol succinate (TOPROL-XL) 50 MG 24 hr tablet TAKE 1 TABLET (50 MG TOTAL) BY MOUTH DAILY. 30 tablet 0  . Multiple Vitamins-Minerals (MULTIVITAMIN WITH MINERALS) tablet Take 1 tablet by mouth daily.    . nitroGLYCERIN (NITROSTAT) 0.4 MG SL tablet Place 0.4 mg under the tongue every 5 (five) minutes as needed. Take 1 tablet under tongue at onset of chest pain; you may repeat every 5 minutes for up to 3 doses.  No current facility-administered medications for this visit.    Allergies-reviewed and updated No Known Allergies  History   Social History  . Marital Status: Married    Spouse Name: N/A    Number of Children: N/A  . Years of Education: N/A   Occupational History  . Drives delivery truck    Social History Main Topics  . Smoking status: Former Smoker -- 1.00 packs/day for 42 years    Types: Cigarettes    Quit date: 08/27/2008  . Smokeless tobacco: None  . Alcohol Use: 0.6 oz/week    1 Shots of liquor per week  . Drug Use: No     Comment: denies  . Sexual Activity: None   Other Topics Concern  . None   Social History  Narrative    [place] in [place] wife.   Married 1977. 1 living daughter (lost one at age 64 to PNA) 2 grandkids. Daughter/grandkids from former relationship.       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        Highest level of education 10th grade   Objective: BP 150/80 mmHg  Temp(Src) 98.4 F (36.9 C)  Wt 173 lb (78.472 kg) Gen: NAD, resting comfortably on table HEENT: Mucous membranes are moist. Oropharynx normal but very poor dentition with plaque build up and loose teeth with slight gum inflammation.  Eyes: muddy sclera, PERRLA Neck: no thyromegaly CV: RRR no murmurs rubs or gallops Lungs: CTAB no crackles, wheeze, rhonchi Abdomen: soft/nontender/nondistended/normal bowel sounds. No rebound or guarding. Hepatomegaly noted 3 cm below costal margin Ext: no edema, 2+ PT pulses Skin: warm, dry, no rash Neuro: 5/5 strength upper and lower extremities and normal reflexes, normal gait.   Assessment/Plan:  CAD (coronary artery disease) Asymptomatic. continue ASA, atorvastatin 80mg , metoprolol. Continue regular cardiology follow up.   Hyperlipidemia Well controlled with last LDL <100 though ideally would want less than 70. Continue atorvastatin. Advised regular exercise.   Essential hypertension Mild poor control. Advised alcohol cessation. Discussed would need to add hctz if does not improve. 2 month follow up.   Alcohol abuse Patient already with hepatomegaly on exam. We discussed elevated alkaline phosphatase likely due to liver damage. Advised alcohol cessation slowly to avoid withdrawal and plan AA follow up. Offered detox but patient declined. This could be contributing to elevated BP as well.    Return precautions advised. Follow up 2 months for BP recheck and check in on cutting out alcohol.

## 2014-04-11 NOTE — Patient Instructions (Addendum)
I strongly recommend you cut down on the alcohol.  Try 1/2 a pint every 2 days for 2 weeks Then 1/2 a pint every 4 days for 2 weeks Then 1/4 pint every 4 days for 2 weeks Then stop If you have any of the below symptoms, seek care We could send you to detox as well.   Encourage you to go to alcoholics anonymous meetings.   See me in 2 months to check in. Hopefully your blood pressure will improve, otherwise we may need to add another medication.   You already have signs of liver damage and we definitely need to quit.   You refused all immunizations at this time and colonoscopy   Alcohol Withdrawal Anytime drug use is interfering with normal living activities it has become abuse. This includes problems with family and friends. Psychological dependence has developed when your mind tells you that the drug is needed. This is usually followed by physical dependence when a continuing increase of drugs are required to get the same feeling or "high." This is known as addiction or chemical dependency. A person's risk is much higher if there is a history of chemical dependency in the family. Mild Withdrawal Following Stopping Alcohol, When Addiction or Chemical Dependency Has Developed When a person has developed tolerance to alcohol, any sudden stopping of alcohol can cause uncomfortable physical symptoms. Most of the time these are mild and consist of tremors in the hands and increases in heart rate, breathing, and temperature. Sometimes these symptoms are associated with anxiety, panic attacks, and bad dreams. There may also be stomach upset. Normal sleep patterns are often interrupted with periods of inability to sleep (insomnia). This may last for 6 months. Because of this discomfort, many people choose to continue drinking to get rid of this discomfort and to try to feel normal. Severe Withdrawal with Decreased or No Alcohol Intake, When Addiction or Chemical Dependency Has Developed About five  percent of alcoholics will develop signs of severe withdrawal when they stop using alcohol. One sign of this is development of generalized seizures (convulsions). Other signs of this are severe agitation and confusion. This may be associated with believing in things which are not real or seeing things which are not really there (delusions and hallucinations). Vitamin deficiencies are usually present if alcohol intake has been long-term. Treatment for this most often requires hospitalization and close observation. Addiction can only be helped by stopping use of all chemicals. This is hard but may save your life. With continual alcohol use, possible outcomes are usually loss of self respect and esteem, violence, and death. Addiction cannot be cured but it can be stopped. This often requires outside help and the care of professionals. Treatment centers are listed in the yellow pages under Cocaine, Narcotics, and Alcoholics Anonymous. Most hospitals and clinics can refer you to a specialized care center. It is not necessary for you to go through the uncomfortable symptoms of withdrawal. Your caregiver can provide you with medicines that will help you through this difficult period. Try to avoid situations, friends, or drugs that made it possible for you to keep using alcohol in the past. Learn how to say no. It takes a long period of time to overcome addictions to all drugs, including alcohol. There may be many times when you feel as though you want a drink. After getting rid of the physical addiction and withdrawal, you will have a lessening of the craving which tells you that you need alcohol to feel  normal. Call your caregiver if more support is needed. Learn who to talk to in your family and among your friends so that during these periods you can receive outside help. Alcoholics Anonymous (AA) has helped many people over the years. To get further help, contact AA or call your caregiver, counselor, or  clergyperson. Al-Anon and Alateen are support groups for friends and family members of an alcoholic. The people who love and care for an alcoholic often need help, too. For information about these organizations, check your phone directory or call a local alcoholism treatment center.  SEEK IMMEDIATE MEDICAL CARE IF:   You have a seizure.  You have a fever.  You experience uncontrolled vomiting or you vomit up blood. This may be bright red or look like black coffee grounds.  You have blood in the stool. This may be bright red or appear as a black, tarry, bad-smelling stool.  You become lightheaded or faint. Do not drive if you feel this way. Have someone else drive you or call [phone] for help.  You become more agitated or confused.  You develop uncontrolled anxiety.  You begin to see things that are not really there (hallucinate). Your caregiver has determined that you completely understand your medical condition, and that your mental state is back to normal. You understand that you have been treated for alcohol withdrawal, have agreed not to drink any alcohol for a minimum of 1 day, will not operate a car or other machinery for 24 hours, and have had an opportunity to ask any questions about your condition. Document Released: 01/23/2005 Document Revised: 07/08/2011 Document Reviewed: 12/02/2007 [facility] Patient Information 2015 [place], [place]. This information is not intended to replace advice given to you by your health care provider. Make sure you discuss any questions you have with your health care provider.

## 2014-04-13 ENCOUNTER — Telehealth: Payer: Self-pay | Admitting: Family Medicine

## 2014-04-13 DIAGNOSIS — K089 Disorder of teeth and supporting structures, unspecified: Secondary | ICD-10-CM

## 2014-04-13 MED ORDER — METOPROLOL SUCCINATE ER 50 MG PO TB24: 50.0000 mg | ORAL_TABLET | Freq: Every day | ORAL | Status: DC

## 2014-04-13 NOTE — Telephone Encounter (Signed)
Pt is a new pt w/ dr Therapist, [facility] and advised by heartcare dr to go to a primary pcp for his meds.  Pt forgot to let the dr know he needed his meds. Pt is out of his meds now  needs refill of  metoprolol succinate (TOPROL-XL) 50 MG 24 hr tablet   [facility]/ [address]  Wife also states pt needs something for pain, his teeth are falling out and his gums are swollen. Pt has been taking oxycodone. Wife prefers something non narcotic.  Pt also needs referral to dentist.

## 2014-04-13 NOTE — Telephone Encounter (Signed)
Can refer. Patient has poor dentition. He told me he wanted to see what his insurance covered since we would not have that information.

## 2014-04-13 NOTE — Telephone Encounter (Signed)
BP meds refilled, pt is also an alcohol abuser and made aware that you generally done Rx oxycondone. Is it ok to place a dental referral? Please advise.

## 2014-04-14 NOTE — Telephone Encounter (Signed)
Dentist referral entered

## 2014-04-18 ENCOUNTER — Telehealth: Payer: Self-pay | Admitting: Cardiovascular Disease

## 2014-04-18 NOTE — Telephone Encounter (Signed)
New message         Pt gums are bleeding and falling out and needs to see the dentist   does pt need to take antibiotics before dental procedure   please give pt a call

## 2014-04-18 NOTE — Telephone Encounter (Signed)
I spoke with the pt's wife and made her aware that the pt does not have a cardiac indication for SBE prior to dental work.

## 2014-05-11 ENCOUNTER — Telehealth: Payer: Self-pay | Admitting: Cardiovascular Disease

## 2014-05-11 NOTE — Telephone Encounter (Signed)
Spoke with pt's wife who reports pt was at dentist yesterday and due to elevated blood pressure he was unable to have teeth removed. Wife reports pt sat for awhile and blood pressure came down but she does not know readings.  Has not checked at home.  She would like pt seen for evaluation of blood pressure so tooth can be removed on May 19, 2014.  I offered appt this afternoon at 3:30 but pt cannot be here today.  I scheduled appt for pt to see Ward Givenshris Berge, PA on May 24, 2012 at 11:30.  I also told pt's wife primary care can adjust blood pressure medications and she will contact them to see if appt available prior to rescheduled tooth removal.  Pt's wife reports pt has been taking his blood pressure medications "off and on".  I encouraged her to have pt take ordered medications on a regular basis.

## 2014-05-11 NOTE — Telephone Encounter (Signed)
New Msg        Pt wife Elease Hashimotoatricia calling, states pt went to the dentist on 05/10/14 and had to sit for four hrs because his BP was severely high.   Pt BP was 192/160. Pt has another appt. On 05/19/14 and is looking to be seen this week so that he can have teeth extracted   Please return call.

## 2014-05-24 ENCOUNTER — Other Ambulatory Visit: Payer: Self-pay | Admitting: Cardiovascular Disease

## 2014-05-24 ENCOUNTER — Ambulatory Visit: Payer: PRIVATE HEALTH INSURANCE | Admitting: Nurse Practitioner

## 2014-06-15 ENCOUNTER — Ambulatory Visit (INDEPENDENT_AMBULATORY_CARE_PROVIDER_SITE_OTHER): Payer: PRIVATE HEALTH INSURANCE | Admitting: Family Medicine

## 2014-06-15 ENCOUNTER — Encounter: Payer: Self-pay | Admitting: Family Medicine

## 2014-06-15 VITALS — BP 128/88 | Temp 98.5°F | Wt 178.0 lb

## 2014-06-15 DIAGNOSIS — I10 Essential (primary) hypertension: Secondary | ICD-10-CM

## 2014-06-15 DIAGNOSIS — F1010 Alcohol abuse, uncomplicated: Secondary | ICD-10-CM

## 2014-06-15 NOTE — Patient Instructions (Signed)
Congrats on quitting drinking!!!!!!  Your blood pressure has come down a good deal since quitting. i wonder if the spikes you were having could have been related to quitting.  No changes in medicine. See me in 3 months. After that if you are still not drinking, may space to 6 months.

## 2014-06-15 NOTE — Assessment & Plan Note (Signed)
Quit without use of AA. I have advised this still for continued cessation. BP likely improved off alcohol. Praised patient success. 3 month follow up to follow up mainly on alcohol.

## 2014-06-15 NOTE — Assessment & Plan Note (Signed)
much improved control despite no medication changes. On Metoprolol 50mg  XL, Lisinopril 40mg , hydralazine 50mg  TID, amlodipine 10mg . This could be accounted for by his cessation from alcohol. Regardless, thrilled by improvement. Previous elevations at dentist may have been related to withdrawal.

## 2014-06-15 NOTE — Progress Notes (Signed)
  Gabriel ConchStephen Huy Majid, MD Phone: [phone]  Subjective:   Gabriel Becker is a 64 y.o. year old very pleasant male patient who presents with the following:  Hypertension-much improved control despite no medication changes. On Metoprolol 50mg  XL, Lisinopril 40mg , hydralazine 50mg  TID, amlodipine 10mg  BP Readings from Last 3 Encounters:  06/15/14 128/88  04/11/14 150/80  10/14/13 152/86  Home BP monitoring-no Compliant with medications-yes without side effects ROS-Denies any CP, HA, SOB, blurry vision, LE edema  Alcohol Abuse- much improved No alcohol in 3 weeks.  ROS- has a hard time describing but did not feel great in first few days off of alcohol but feels back to normal now.   Past Medical History- Patient Active Problem List   Diagnosis Date Noted  . CAD (coronary artery disease) 03/07/2009    Priority: High  . Alcohol abuse 04/11/2014    Priority: Medium  . Former smoker 04/11/2014    Priority: Medium  . Hyperlipidemia 03/07/2009    Priority: Medium  . Essential hypertension 03/07/2009    Priority: Medium  . Arthritis 04/11/2014    Priority: Low   Medications- reviewed and updated Current Outpatient Prescriptions  Medication Sig Dispense Refill  . amLODipine (NORVASC) 10 MG tablet Take 1 tablet (10 mg total) by mouth daily. 30 tablet 11  . aspirin 81 MG tablet Take 1 tablet (81 mg total) by mouth daily. 30 tablet 0  . atorvastatin (LIPITOR) 80 MG tablet Take 1 tablet (80 mg total) by mouth daily. 30 tablet 11  . hydrALAZINE (APRESOLINE) 25 MG tablet Take 1 tablet (25 mg total) by mouth 3 (three) times daily. 90 tablet 11  . ibuprofen (ADVIL,MOTRIN) 200 MG tablet Take 200 mg by mouth 2 (two) times daily.      Marland Kitchen. lisinopril (PRINIVIL,ZESTRIL) 40 MG tablet TAKE 1 TABLET BY MOUTH EVERY DAY 14 tablet 0  . metoprolol succinate (TOPROL-XL) 50 MG 24 hr tablet Take 1 tablet (50 mg total) by mouth daily. Take with or immediately following a meal. 30 tablet 3  . Multiple  Vitamins-Minerals (MULTIVITAMIN WITH MINERALS) tablet Take 1 tablet by mouth daily.    . nitroGLYCERIN (NITROSTAT) 0.4 MG SL tablet Place 0.4 mg under the tongue every 5 (five) minutes as needed. Take 1 tablet under tongue at onset of chest pain; you may repeat every 5 minutes for up to 3 doses.      No current facility-administered medications for this visit.    Objective: BP 128/88 mmHg  Temp(Src) 98.5 F (36.9 C)  Wt 178 lb (80.74 kg) Gen: NAD, resting comfortably Poor dentition. Has 4 fewer teeth since last time (extractions per dentist) CV: RRR no murmurs rubs or gallops Lungs: CTAB no crackles, wheeze, rhonchi Abdomen: soft/nontender/nondistended/normal bowel sounds. Did not examine for hepatomegaly.  Ext: no edema Skin: warm, dry, no rash  Neuro: grossly normal, moves all extremities, normal gait   Assessment/Plan:  Essential hypertension much improved control despite no medication changes. On Metoprolol 50mg  XL, Lisinopril 40mg , hydralazine 50mg  TID, amlodipine 10mg . This could be accounted for by his cessation from alcohol. Regardless, thrilled by improvement. Previous elevations at dentist may have been related to withdrawal.    Alcohol abuse Quit without use of AA. I have advised this still for continued cessation. BP likely improved off alcohol. Praised patient success. 3 month follow up to follow up mainly on alcohol.     Return precautions advised. 3 month follow up planned.

## 2014-07-28 ENCOUNTER — Other Ambulatory Visit: Payer: Self-pay | Admitting: Cardiovascular Disease

## 2014-09-06 ENCOUNTER — Other Ambulatory Visit: Payer: Self-pay | Admitting: Physician Assistant

## 2014-09-08 ENCOUNTER — Other Ambulatory Visit: Payer: Self-pay | Admitting: Physician Assistant

## 2014-09-09 NOTE — Telephone Encounter (Signed)
Per note 5.30.15

## 2014-09-13 ENCOUNTER — Ambulatory Visit (INDEPENDENT_AMBULATORY_CARE_PROVIDER_SITE_OTHER): Payer: PRIVATE HEALTH INSURANCE | Admitting: Family Medicine

## 2014-09-13 ENCOUNTER — Encounter: Payer: Self-pay | Admitting: Family Medicine

## 2014-09-13 DIAGNOSIS — F1010 Alcohol abuse, uncomplicated: Secondary | ICD-10-CM

## 2014-09-13 DIAGNOSIS — I10 Essential (primary) hypertension: Secondary | ICD-10-CM | POA: Diagnosis not present

## 2014-09-13 MED ORDER — LOSARTAN POTASSIUM-HCTZ 100-25 MG PO TABS: 1.0000 | ORAL_TABLET | Freq: Every day | ORAL | Status: DC

## 2014-09-13 NOTE — Progress Notes (Signed)
Tana ConchStephen Hunter, MD  Subjective:  Gabriel MisCarl Becker is a 64 y.o. year old very pleasant male patient who presents with:  Hypertension-poor control  BP Readings from Last 3 Encounters:  09/13/14 164/88  06/15/14 128/88  04/11/14 150/80   Home BP monitoring-no Compliant with medications-yes without side effects, see current meds below ROS-Denies any CP, HA, SOB, blurry vision, LE edema, transient weakness, orthopnea, PND.   Alcohol abuse -previously drank a pint of smirnoff 80 proof every 2 nights. Pint every 3 nights now-has returned to drinking after being alcohol free last visit  Past Medical History- CAD not followed by cards, HLD, former smoker  Medications- reviewed and updated Current Outpatient Prescriptions  Medication Sig Dispense Refill  . amLODipine (NORVASC) 10 MG tablet TAKE 1 TABLET (10 MG TOTAL) BY MOUTH DAILY. 30 tablet 1  . aspirin 81 MG tablet Take 1 tablet (81 mg total) by mouth daily. 30 tablet 0  . atorvastatin (LIPITOR) 80 MG tablet Take 1 tablet (80 mg total) by mouth daily. 30 tablet 11  . hydrALAZINE (APRESOLINE) 25 MG tablet Take 1 tablet (25 mg total) by mouth 3 (three) times daily. 90 tablet 11  . lisinopril (PRINIVIL,ZESTRIL) 40 MG tablet TAKE 1 TABLET BY MOUTH EVERY DAY 30 tablet 0  . metoprolol succinate (TOPROL-XL) 50 MG 24 hr tablet Take 1 tablet (50 mg total) by mouth daily. Take with or immediately following a meal. 30 tablet 3  . Multiple Vitamins-Minerals (MULTIVITAMIN WITH MINERALS) tablet Take 1 tablet by mouth daily.    Marland Kitchen. ibuprofen (ADVIL,MOTRIN) 200 MG tablet Take 200 mg by mouth 2 (two) times daily.      . nitroGLYCERIN (NITROSTAT) 0.4 MG SL tablet Place 0.4 mg under the tongue every 5 (five) minutes as needed. Take 1 tablet under tongue at onset of chest pain; you may repeat every 5 minutes for up to 3 doses.      No current facility-administered medications for this visit.    Objective: BP 164/88 mmHg  Pulse 66  Temp(Src) 98.6 F (37 C)   Wt 173 lb (78.472 kg) Gen: NAD, resting comfortably in chair New dentures in place CV: RRR no murmurs rubs or gallops Lungs: CTAB no crackles, wheeze, rhonchi Abdomen: soft/nontender/nondistended/normal bowel sounds.  Ext: no edema Skin: warm, dry, no rash Neuro: grossly normal, moves all extremities   Assessment/Plan:  Essential hypertension Poor control on Metoprolol 50mg  XL, Lisinopril 40mg , hydralazine 50mg  TID, amlodipine 10mg . Lisinopril 40mg -->change to losartan 100-hctz 25mg , follow up in a few weeks. BP seems to be higher when drinking too and advised cessation.    Alcohol abuse Encouraged complete cessation. Advised AA.    3-4 week, emergent precautions advised  Meds ordered this encounter  Medications  . losartan-hydrochlorothiazide (HYZAAR) 100-25 MG per tablet    Sig: Take 1 tablet by mouth daily.    Dispense:  30 tablet    Refill:  5

## 2014-09-13 NOTE — Patient Instructions (Signed)
Strongly advise you to quit drinking and use AA  Stop lisinopril on day you start losartan-hctz in its place  See me 3-4 weeks from today. If you can get a morning appointment and come in fasting, we can update your labs

## 2014-09-13 NOTE — Assessment & Plan Note (Signed)
Poor control on Metoprolol 50mg  XL, Lisinopril 40mg , hydralazine 50mg  TID, amlodipine 10mg . Lisinopril 40mg -->change to losartan 100-hctz 25mg , follow up in a few weeks. BP seems to be higher when drinking too and advised cessation.

## 2014-09-13 NOTE — Assessment & Plan Note (Signed)
Encouraged complete cessation. Advised AA.

## 2014-09-22 ENCOUNTER — Other Ambulatory Visit: Payer: Self-pay | Admitting: Physician Assistant

## 2014-09-23 ENCOUNTER — Other Ambulatory Visit: Payer: Self-pay | Admitting: Physician Assistant

## 2014-09-23 NOTE — Telephone Encounter (Signed)
Per note 4.30.15

## 2014-09-30 ENCOUNTER — Telehealth: Payer: Self-pay | Admitting: Family Medicine

## 2014-09-30 DIAGNOSIS — I10 Essential (primary) hypertension: Secondary | ICD-10-CM

## 2014-09-30 MED ORDER — METOPROLOL SUCCINATE ER 50 MG PO TB24: 50.0000 mg | ORAL_TABLET | Freq: Every day | ORAL | Status: DC

## 2014-09-30 NOTE — Telephone Encounter (Signed)
Pt request refill  metoprolol succinate (TOPROL-XL) 50 MG 24 hr tablet [facility]/ [address]    Wife states pt has fup on 6/17.  She states pt really needs to have labs done and DM runs in his family too.

## 2014-09-30 NOTE — Telephone Encounter (Signed)
CMP and CBC under hypertension Last lipids < 1 year so insurance may not pay unfortunately Yes may refill

## 2014-09-30 NOTE — Telephone Encounter (Signed)
Okay to place lab orders and which tests?

## 2014-09-30 NOTE — Telephone Encounter (Signed)
Spoke with patient and he wants to wait until his appointment for his labs.  Lab orders placed and refill sent.

## 2014-10-14 ENCOUNTER — Encounter: Payer: Self-pay | Admitting: Family Medicine

## 2014-10-14 ENCOUNTER — Ambulatory Visit (INDEPENDENT_AMBULATORY_CARE_PROVIDER_SITE_OTHER): Payer: PRIVATE HEALTH INSURANCE | Admitting: Family Medicine

## 2014-10-14 VITALS — BP 118/78 | HR 67 | Temp 98.8°F | Wt 166.0 lb

## 2014-10-14 DIAGNOSIS — I10 Essential (primary) hypertension: Secondary | ICD-10-CM | POA: Diagnosis not present

## 2014-10-14 DIAGNOSIS — F1010 Alcohol abuse, uncomplicated: Secondary | ICD-10-CM

## 2014-10-14 LAB — CBC WITH DIFFERENTIAL/PLATELET
BASOS ABS: 0 10*3/uL (ref 0.0–0.1)
Basophils Relative: 0 % (ref 0–1)
Eosinophils Absolute: 0.1 10*3/uL (ref 0.0–0.7)
Eosinophils Relative: 1 % (ref 0–5)
HCT: 37.1 % — ABNORMAL LOW (ref 39.0–52.0)
Hemoglobin: 12.4 g/dL — ABNORMAL LOW (ref 13.0–17.0)
LYMPHS PCT: 34 % (ref 12–46)
Lymphs Abs: 3.6 10*3/uL (ref 0.7–4.0)
MCH: 28 pg (ref 26.0–34.0)
MCHC: 33.4 g/dL (ref 30.0–36.0)
MCV: 83.7 fL (ref 78.0–100.0)
MONO ABS: 0.5 10*3/uL (ref 0.1–1.0)
MONOS PCT: 5 % (ref 3–12)
MPV: 11.7 fL (ref 8.6–12.4)
NEUTROS ABS: 6.4 10*3/uL (ref 1.7–7.7)
NEUTROS PCT: 60 % (ref 43–77)
Platelets: 206 10*3/uL (ref 150–400)
RBC: 4.43 MIL/uL (ref 4.22–5.81)
RDW: 13.7 % (ref 11.5–15.5)
WBC: 10.7 10*3/uL — ABNORMAL HIGH (ref 4.0–10.5)

## 2014-10-14 NOTE — Assessment & Plan Note (Signed)
Advised AA and complete cessation. Patient states likely to quickly return to drinking but states will try to moderate use. Stressed importance of quitting permanently but patient laughs this idea off.

## 2014-10-14 NOTE — Assessment & Plan Note (Signed)
Well controlled. Continue current meds: Metoprolol 50mg  XL,  losartan 100-hctz 25mg , hydralazine 50mg  TID, amlodipine 10mg 

## 2014-10-14 NOTE — Progress Notes (Signed)
Tana Conch, MD  Subjective:  Gabriel Becker is a 64 y.o. year old very pleasant male patient who presents with:  Hypertension-controlled with addition of hctz last visit  BP Readings from Last 3 Encounters:  10/14/14 118/78  09/13/14 164/88  06/15/14 128/88   Home BP monitoring-no Compliant with medications-yes without side effects ROS-Denies any CP, HA, SOB, blurry vision, LE edema  Alcohol abuse- controlled today - no drinks in last month but states he just wanted to get BP controlled and he will likely restart though at lower levels ROS- no RUQ pain, edema  Past Medical History- CAD, HLD  Medications- reviewed and updated Current Outpatient Prescriptions  Medication Sig Dispense Refill  . amLODipine (NORVASC) 10 MG tablet TAKE 1 TABLET (10 MG TOTAL) BY MOUTH DAILY. 30 tablet 1  . aspirin 81 MG tablet Take 1 tablet (81 mg total) by mouth daily. 30 tablet 0  . atorvastatin (LIPITOR) 80 MG tablet TAKE 1 TABLET (80 MG TOTAL) BY MOUTH DAILY. 30 tablet 2  . hydrALAZINE (APRESOLINE) 25 MG tablet Take 1 tablet (25 mg total) by mouth 3 (three) times daily. 90 tablet 11  . losartan-hydrochlorothiazide (HYZAAR) 100-25 MG per tablet Take 1 tablet by mouth daily. 30 tablet 5  . metoprolol succinate (TOPROL-XL) 50 MG 24 hr tablet Take 1 tablet (50 mg total) by mouth daily. Take with or immediately following a meal. 30 tablet 3  . Multiple Vitamins-Minerals (MULTIVITAMIN WITH MINERALS) tablet Take 1 tablet by mouth daily.    Marland Kitchen ibuprofen (ADVIL,MOTRIN) 200 MG tablet Take 200 mg by mouth 2 (two) times daily.      . nitroGLYCERIN (NITROSTAT) 0.4 MG SL tablet Place 0.4 mg under the tongue every 5 (five) minutes as needed. Take 1 tablet under tongue at onset of chest pain; you may repeat every 5 minutes for up to 3 doses.      No current facility-administered medications for this visit.    Objective: BP 118/78 mmHg  Pulse 67  Temp(Src) 98.8 F (37.1 C)  Wt 166 lb (75.297 kg) Gen: NAD,  resting comfortably CV: RRR no murmurs rubs or gallops Lungs: CTAB no crackles, wheeze, rhonchi Abdomen: soft/nontender/nondistended/normal bowel sounds. No rebound or guarding.  Ext: no edema Skin: warm, dry Neuro: grossly normal, moves all extremities  Assessment/Plan:  Essential hypertension Well controlled. Continue current meds: Metoprolol 50mg  XL,  losartan 100-hctz 25mg , hydralazine 50mg  TID, amlodipine 10mg     Alcohol abuse Advised AA and complete cessation. Patient states likely to quickly return to drinking but states will try to moderate use. Stressed importance of quitting permanently but patient laughs this idea off.   6 months f/u  Orders Placed This Encounter  Procedures  . Comprehensive metabolic panel  . CBC with Differential/Platelet

## 2014-10-14 NOTE — Patient Instructions (Addendum)
Blood pressure looks much better-continue current medications  Advise you to not drink any alcohol and consider AA meetings  Follow up 6 months  Labs before you leave- ordered 09/30/14

## 2014-10-15 LAB — COMPREHENSIVE METABOLIC PANEL
ALK PHOS: 87 U/L (ref 39–117)
ALT: 24 U/L (ref 0–53)
AST: 23 U/L (ref 0–37)
Albumin: 4.1 g/dL (ref 3.5–5.2)
BILIRUBIN TOTAL: 0.5 mg/dL (ref 0.2–1.2)
BUN: 31 mg/dL — ABNORMAL HIGH (ref 6–23)
CO2: 27 mEq/L (ref 19–32)
CREATININE: 2.03 mg/dL — AB (ref 0.50–1.35)
Calcium: 9.3 mg/dL (ref 8.4–10.5)
Chloride: 100 mEq/L (ref 96–112)
Glucose, Bld: 87 mg/dL (ref 70–99)
Potassium: 4.4 mEq/L (ref 3.5–5.3)
SODIUM: 138 meq/L (ref 135–145)
TOTAL PROTEIN: 7.3 g/dL (ref 6.0–8.3)

## 2014-10-17 NOTE — Addendum Note (Signed)
Addended by: Lieutenant Diego A on: 10/17/2014 09:34 AM   Modules accepted: Orders

## 2015-01-04 ENCOUNTER — Other Ambulatory Visit: Payer: Self-pay | Admitting: Cardiology

## 2015-01-25 ENCOUNTER — Other Ambulatory Visit: Payer: Self-pay | Admitting: Cardiovascular Disease

## 2015-02-13 ENCOUNTER — Other Ambulatory Visit: Payer: Self-pay | Admitting: Cardiology

## 2015-02-13 ENCOUNTER — Ambulatory Visit: Payer: PRIVATE HEALTH INSURANCE | Admitting: Family Medicine

## 2015-02-21 ENCOUNTER — Other Ambulatory Visit: Payer: Self-pay | Admitting: Cardiovascular Disease

## 2015-02-22 NOTE — Telephone Encounter (Signed)
Pt was last seen in April 2015 with 6 month follow up planned.  He cancelled appt with Ward Givenshris Berge, NP that was schedueled for 05/24/14.  Notes have been sent to pharmacy indicating pt needs to call office to schedule appt for further refills. I placed call to home number and left message to call back.  Message on mobile number states call cannot be completed at this time

## 2015-02-22 NOTE — Telephone Encounter (Signed)
Please advise on refill as a note has been put on his last few rx's to call and schedule an appointment. He still has not done so. Thanks, MI

## 2015-02-28 NOTE — Telephone Encounter (Signed)
Left another message to call back

## 2015-03-06 NOTE — Telephone Encounter (Signed)
Spoke with pt's wife and asked her to have pt call office.

## 2015-03-08 NOTE — Telephone Encounter (Signed)
Phone calls have not been returned.  Please refuse this refill and send note to pharmacy asking pt to contact office to schedule appt.

## 2015-04-14 ENCOUNTER — Other Ambulatory Visit: Payer: Self-pay | Admitting: Cardiology

## 2015-10-23 ENCOUNTER — Telehealth: Payer: Self-pay

## 2015-10-23 NOTE — Telephone Encounter (Signed)
-----   Message from Shelva MajesticStephen O Hunter, MD sent at 10/23/2015  1:23 PM EDT ----- Please call patient- needs to come in for visit- seems last seen over year ago and was supposed to come back to have kidney function checked- we should just do a visit at this point  ----- Message -----    From: SYSTEM    Sent: 10/22/2015  12:05 AM      To: Shelva MajesticStephen O Hunter, MD

## 2015-10-23 NOTE — Telephone Encounter (Signed)
Called both cell and home numbers listed. Cell number wasn't working and no answer or voicemail at home. Will call again.

## 2016-01-24 ENCOUNTER — Telehealth: Payer: Self-pay

## 2016-01-24 NOTE — Telephone Encounter (Signed)
Called and spoke with patient encouraging him to schedule an appointment for a physical. Patient refused to schedule an appointment or have labs drawn. Instructed patient to call if he needs anything. Verbalized understanding

## 2022-10-07 ENCOUNTER — Emergency Department (HOSPITAL_COMMUNITY): Payer: Medicare Other

## 2022-10-07 ENCOUNTER — Encounter (HOSPITAL_COMMUNITY): Payer: Self-pay | Admitting: Internal Medicine

## 2022-10-07 ENCOUNTER — Inpatient Hospital Stay (HOSPITAL_COMMUNITY)
Admission: EM | Admit: 2022-10-07 | Discharge: 2022-10-16 | DRG: 558 | Disposition: A | Discharge status: Hospice | Payer: Medicare Other | Attending: Internal Medicine | Admitting: Internal Medicine

## 2022-10-07 DIAGNOSIS — T50916A Underdosing of multiple unspecified drugs, medicaments and biological substances, initial encounter: Secondary | ICD-10-CM | POA: Diagnosis present

## 2022-10-07 DIAGNOSIS — R296 Repeated falls: Secondary | ICD-10-CM | POA: Diagnosis present

## 2022-10-07 DIAGNOSIS — F1027 Alcohol dependence with alcohol-induced persisting dementia: Secondary | ICD-10-CM | POA: Diagnosis present

## 2022-10-07 DIAGNOSIS — R7989 Other specified abnormal findings of blood chemistry: Secondary | ICD-10-CM

## 2022-10-07 DIAGNOSIS — I5022 Chronic systolic (congestive) heart failure: Secondary | ICD-10-CM | POA: Diagnosis present

## 2022-10-07 DIAGNOSIS — Z7982 Long term (current) use of aspirin: Secondary | ICD-10-CM

## 2022-10-07 DIAGNOSIS — F4381 Prolonged grief disorder: Secondary | ICD-10-CM | POA: Diagnosis present

## 2022-10-07 DIAGNOSIS — N179 Acute kidney failure, unspecified: Secondary | ICD-10-CM | POA: Diagnosis present

## 2022-10-07 DIAGNOSIS — E872 Acidosis, unspecified: Secondary | ICD-10-CM | POA: Diagnosis present

## 2022-10-07 DIAGNOSIS — E870 Hyperosmolality and hypernatremia: Secondary | ICD-10-CM | POA: Diagnosis present

## 2022-10-07 DIAGNOSIS — I2510 Atherosclerotic heart disease of native coronary artery without angina pectoris: Secondary | ICD-10-CM | POA: Diagnosis present

## 2022-10-07 DIAGNOSIS — F919 Conduct disorder, unspecified: Secondary | ICD-10-CM | POA: Diagnosis present

## 2022-10-07 DIAGNOSIS — M6282 Rhabdomyolysis: Secondary | ICD-10-CM | POA: Diagnosis present

## 2022-10-07 DIAGNOSIS — R627 Adult failure to thrive: Secondary | ICD-10-CM | POA: Diagnosis present

## 2022-10-07 DIAGNOSIS — I10 Essential (primary) hypertension: Secondary | ICD-10-CM | POA: Diagnosis not present

## 2022-10-07 DIAGNOSIS — R451 Restlessness and agitation: Secondary | ICD-10-CM | POA: Diagnosis present

## 2022-10-07 DIAGNOSIS — Z9181 History of falling: Secondary | ICD-10-CM

## 2022-10-07 DIAGNOSIS — N289 Disorder of kidney and ureter, unspecified: Secondary | ICD-10-CM

## 2022-10-07 DIAGNOSIS — Z955 Presence of coronary angioplasty implant and graft: Secondary | ICD-10-CM

## 2022-10-07 DIAGNOSIS — I480 Paroxysmal atrial fibrillation: Secondary | ICD-10-CM | POA: Diagnosis present

## 2022-10-07 DIAGNOSIS — E785 Hyperlipidemia, unspecified: Secondary | ICD-10-CM | POA: Diagnosis not present

## 2022-10-07 DIAGNOSIS — I4891 Unspecified atrial fibrillation: Secondary | ICD-10-CM | POA: Diagnosis not present

## 2022-10-07 DIAGNOSIS — E7800 Pure hypercholesterolemia, unspecified: Secondary | ICD-10-CM | POA: Diagnosis present

## 2022-10-07 DIAGNOSIS — Z555 Less than a high school diploma: Secondary | ICD-10-CM

## 2022-10-07 DIAGNOSIS — Z91148 Patient's other noncompliance with medication regimen for other reason: Secondary | ICD-10-CM

## 2022-10-07 DIAGNOSIS — Z91128 Patient's intentional underdosing of medication regimen for other reason: Secondary | ICD-10-CM

## 2022-10-07 DIAGNOSIS — I2489 Other forms of acute ischemic heart disease: Secondary | ICD-10-CM | POA: Diagnosis present

## 2022-10-07 DIAGNOSIS — W08XXXA Fall from other furniture, initial encounter: Secondary | ICD-10-CM | POA: Diagnosis present

## 2022-10-07 DIAGNOSIS — Z781 Physical restraint status: Secondary | ICD-10-CM

## 2022-10-07 DIAGNOSIS — Z634 Disappearance and death of family member: Secondary | ICD-10-CM

## 2022-10-07 DIAGNOSIS — Z681 Body mass index (BMI) 19 or less, adult: Secondary | ICD-10-CM | POA: Diagnosis not present

## 2022-10-07 DIAGNOSIS — Z515 Encounter for palliative care: Secondary | ICD-10-CM | POA: Diagnosis not present

## 2022-10-07 DIAGNOSIS — F1010 Alcohol abuse, uncomplicated: Secondary | ICD-10-CM | POA: Diagnosis present

## 2022-10-07 DIAGNOSIS — Z823 Family history of stroke: Secondary | ICD-10-CM

## 2022-10-07 DIAGNOSIS — R64 Cachexia: Secondary | ICD-10-CM | POA: Diagnosis present

## 2022-10-07 DIAGNOSIS — Z751 Person awaiting admission to adequate facility elsewhere: Secondary | ICD-10-CM

## 2022-10-07 DIAGNOSIS — Z87891 Personal history of nicotine dependence: Secondary | ICD-10-CM

## 2022-10-07 DIAGNOSIS — E538 Deficiency of other specified B group vitamins: Secondary | ICD-10-CM | POA: Diagnosis present

## 2022-10-07 DIAGNOSIS — E860 Dehydration: Secondary | ICD-10-CM | POA: Diagnosis present

## 2022-10-07 DIAGNOSIS — I252 Old myocardial infarction: Secondary | ICD-10-CM

## 2022-10-07 DIAGNOSIS — Z8249 Family history of ischemic heart disease and other diseases of the circulatory system: Secondary | ICD-10-CM

## 2022-10-07 DIAGNOSIS — E440 Moderate protein-calorie malnutrition: Secondary | ICD-10-CM | POA: Diagnosis present

## 2022-10-07 DIAGNOSIS — I959 Hypotension, unspecified: Secondary | ICD-10-CM | POA: Diagnosis not present

## 2022-10-07 DIAGNOSIS — I110 Hypertensive heart disease with heart failure: Secondary | ICD-10-CM | POA: Diagnosis present

## 2022-10-07 DIAGNOSIS — Z66 Do not resuscitate: Secondary | ICD-10-CM | POA: Diagnosis not present

## 2022-10-07 DIAGNOSIS — N19 Unspecified kidney failure: Secondary | ICD-10-CM

## 2022-10-07 DIAGNOSIS — E876 Hypokalemia: Secondary | ICD-10-CM | POA: Diagnosis not present

## 2022-10-07 DIAGNOSIS — I5020 Unspecified systolic (congestive) heart failure: Secondary | ICD-10-CM | POA: Diagnosis not present

## 2022-10-07 DIAGNOSIS — Z7189 Other specified counseling: Secondary | ICD-10-CM | POA: Diagnosis not present

## 2022-10-07 DIAGNOSIS — Z79899 Other long term (current) drug therapy: Secondary | ICD-10-CM

## 2022-10-07 DIAGNOSIS — Z046 Encounter for general psychiatric examination, requested by authority: Secondary | ICD-10-CM

## 2022-10-07 LAB — COMPREHENSIVE METABOLIC PANEL
ALT: 17 U/L (ref 0–44)
AST: 52 U/L — ABNORMAL HIGH (ref 15–41)
Albumin: 3.6 g/dL (ref 3.5–5.0)
Alkaline Phosphatase: 63 U/L (ref 38–126)
Anion gap: 23 — ABNORMAL HIGH (ref 5–15)
BUN: 63 mg/dL — ABNORMAL HIGH (ref 8–23)
CO2: 24 mmol/L (ref 22–32)
Calcium: 10.1 mg/dL (ref 8.9–10.3)
Chloride: 100 mmol/L (ref 98–111)
Creatinine, Ser: 1.84 mg/dL — ABNORMAL HIGH (ref 0.61–1.24)
GFR, Estimated: 38 mL/min — ABNORMAL LOW (ref >60)
Glucose, Bld: 147 mg/dL — ABNORMAL HIGH (ref 70–99)
Potassium: 3.9 mmol/L (ref 3.5–5.1)
Sodium: 147 mmol/L — ABNORMAL HIGH (ref 135–145)
Total Bilirubin: 1.2 mg/dL (ref 0.3–1.2)
Total Protein: 7.4 g/dL (ref 6.5–8.1)

## 2022-10-07 LAB — CBC WITH DIFFERENTIAL/PLATELET
Abs Immature Granulocytes: 0.06 10*3/uL (ref 0.00–0.07)
Basophils Absolute: 0 10*3/uL (ref 0.0–0.1)
Basophils Relative: 0 %
Eosinophils Absolute: 0 10*3/uL (ref 0.0–0.5)
Eosinophils Relative: 0 %
HCT: 45.5 % (ref 39.0–52.0)
Hemoglobin: 14.8 g/dL (ref 13.0–17.0)
Immature Granulocytes: 1 %
Lymphocytes Relative: 15 %
Lymphs Abs: 1.5 10*3/uL (ref 0.7–4.0)
MCH: 31.2 pg (ref 26.0–34.0)
MCHC: 32.5 g/dL (ref 30.0–36.0)
MCV: 95.8 fL (ref 80.0–100.0)
Monocytes Absolute: 0.7 10*3/uL (ref 0.1–1.0)
Monocytes Relative: 7 %
Neutro Abs: 7.8 10*3/uL — ABNORMAL HIGH (ref 1.7–7.7)
Neutrophils Relative %: 77 %
Platelets: 201 10*3/uL (ref 150–400)
RBC: 4.75 MIL/uL (ref 4.22–5.81)
RDW: 14 % (ref 11.5–15.5)
WBC: 10 10*3/uL (ref 4.0–10.5)
nRBC: 0.2 % (ref 0.0–0.2)

## 2022-10-07 LAB — LACTIC ACID, PLASMA
Lactic Acid, Venous: 2.9 mmol/L (ref 0.5–1.9)
Lactic Acid, Venous: 1.6 mmol/L (ref 0.5–1.9)

## 2022-10-07 LAB — ETHANOL: Alcohol, Ethyl (B): <10 mg/dL (ref <10)

## 2022-10-07 LAB — MAGNESIUM: Magnesium: 2.6 mg/dL — ABNORMAL HIGH (ref 1.7–2.4)

## 2022-10-07 LAB — TROPONIN I (HIGH SENSITIVITY)
Troponin I (High Sensitivity): 168 ng/L (ref <18)
Troponin I (High Sensitivity): 171 ng/L (ref <18)

## 2022-10-07 LAB — CK: Total CK: 2363 U/L — ABNORMAL HIGH (ref 49–397)

## 2022-10-07 MED ORDER — THIAMINE HCL 100 MG/ML IJ SOLN
100.0000 mg | INTRAMUSCULAR | Status: DC
  Administered 2022-10-08: 100 mg via INTRAVENOUS
  Filled 2022-10-07: qty 2

## 2022-10-07 MED ORDER — ENOXAPARIN SODIUM 40 MG/0.4ML IJ SOSY
40.0000 mg | PREFILLED_SYRINGE | INTRAMUSCULAR | Status: DC
  Administered 2022-10-07 – 2022-10-13 (×7): 40 mg via SUBCUTANEOUS
  Filled 2022-10-07 (×7): qty 0.4

## 2022-10-07 MED ORDER — SODIUM CHLORIDE 0.45 % IV SOLN
INTRAVENOUS | Status: DC
  Administered 2022-10-08: 100 mL/h via INTRAVENOUS

## 2022-10-07 MED ORDER — ADULT MULTIVITAMIN W/MINERALS CH
1.0000 | ORAL_TABLET | Freq: Every day | ORAL | Status: DC
  Administered 2022-10-12 – 2022-10-14 (×3): 1 via ORAL
  Filled 2022-10-07 (×6): qty 1

## 2022-10-07 MED ORDER — THIAMINE HCL 100 MG/ML IJ SOLN
100.0000 mg | Freq: Once | INTRAMUSCULAR | Status: AC
  Administered 2022-10-07: 100 mg via INTRAVENOUS
  Filled 2022-10-07: qty 2

## 2022-10-07 MED ORDER — ACETAMINOPHEN 325 MG PO TABS: 650.0000 mg | ORAL_TABLET | Freq: Four times a day (QID) | ORAL | Status: DC | PRN

## 2022-10-07 MED ORDER — LORAZEPAM 1 MG PO TABS: 1.0000 mg | ORAL_TABLET | ORAL | Status: AC | PRN

## 2022-10-07 MED ORDER — ONDANSETRON HCL 4 MG PO TABS: 4.0000 mg | ORAL_TABLET | Freq: Four times a day (QID) | ORAL | Status: DC | PRN

## 2022-10-07 MED ORDER — SODIUM CHLORIDE 0.9% FLUSH
3.0000 mL | Freq: Two times a day (BID) | INTRAVENOUS | Status: DC
  Administered 2022-10-08 – 2022-10-15 (×16): 3 mL via INTRAVENOUS

## 2022-10-07 MED ORDER — FOLIC ACID 1 MG PO TABS
1.0000 mg | ORAL_TABLET | Freq: Every day | ORAL | Status: DC
  Filled 2022-10-07 (×3): qty 1

## 2022-10-07 MED ORDER — ONDANSETRON HCL 4 MG/2ML IJ SOLN: 4.0000 mg | Freq: Four times a day (QID) | INTRAMUSCULAR | Status: DC | PRN

## 2022-10-07 MED ORDER — SENNOSIDES-DOCUSATE SODIUM 8.6-50 MG PO TABS: 1.0000 | ORAL_TABLET | Freq: Every evening | ORAL | Status: DC | PRN

## 2022-10-07 MED ORDER — ACETAMINOPHEN 650 MG RE SUPP: 650.0000 mg | Freq: Four times a day (QID) | RECTAL | Status: DC | PRN

## 2022-10-07 MED ORDER — LACTATED RINGERS IV BOLUS
1000.0000 mL | Freq: Once | INTRAVENOUS | Status: AC
  Administered 2022-10-07: 1000 mL via INTRAVENOUS

## 2022-10-07 MED ORDER — LORAZEPAM 2 MG/ML IJ SOLN
1.0000 mg | INTRAMUSCULAR | Status: AC | PRN
  Administered 2022-10-07: 1 mg via INTRAVENOUS
  Filled 2022-10-07: qty 1

## 2022-10-07 MED ORDER — METOPROLOL SUCCINATE ER 25 MG PO TB24: 50.0000 mg | ORAL_TABLET | Freq: Every day | ORAL | Status: DC

## 2022-10-07 MED ORDER — AMLODIPINE BESYLATE 5 MG PO TABS: 10.0000 mg | ORAL_TABLET | Freq: Once | ORAL | Status: DC

## 2022-10-07 MED ORDER — HYDRALAZINE HCL 25 MG PO TABS: 25.0000 mg | ORAL_TABLET | Freq: Once | ORAL | Status: DC

## 2022-10-07 MED ORDER — METOPROLOL TARTRATE 5 MG/5ML IV SOLN
5.0000 mg | Freq: Once | INTRAVENOUS | Status: AC
  Administered 2022-10-07: 5 mg via INTRAVENOUS
  Filled 2022-10-07: qty 5

## 2022-10-07 MED ORDER — SODIUM CHLORIDE 0.9 % IV BOLUS
1000.0000 mL | Freq: Once | INTRAVENOUS | Status: AC
  Administered 2022-10-07: 1000 mL via INTRAVENOUS

## 2022-10-07 NOTE — ED Notes (Signed)
Date and time results received: 10/07/22 3:46 PM  (use smartphrase ".now" to insert current time)  Test: trop  Critical Value: 168  Name of Provider Notified: Elpidio Anis, MD Orders Received? Or Actions Taken?: see chart

## 2022-10-07 NOTE — ED Provider Notes (Signed)
Patient signed out to me by previous provider. Please refer to their note for full HPI.  Briefly this is a 72 year old male who was brought in under IVC for concern of self-neglect.  Noted to be in rhabdomyolysis with AKI and demand ischemia.  Troponin is flat with no significant delta.  Patient signed out pending CT imaging which was negative for acute finding.  Patient is rude and at times violent with staff, sometimes refusing medications.  He will need a full psychiatric evaluation including capacity but at this time cannot be medically cleared so he will be admitted to hospitalist.  Patients evaluation and results requires admission for further treatment and care.  Spoke with hospitalist, reviewed patient's ED course and they accept admission.  Patient agrees with admission plan, offers no new complaints and is stable/unchanged at time of admit.   Rozelle Logan, DO 10/07/22 1840

## 2022-10-07 NOTE — Hospital Course (Signed)
Gabriel Becker is a 72 y.o. male with medical history significant for CAD s/p STEMI and DES dRCA 2010, HTN, HLD, alcohol use who is admitted with failure to thrive complicated by rhabdomyolysis, AKI, and volume depletion.  Presented under IVC paperwork.  Likely will need psychiatry evaluation once more medically optimized.

## 2022-10-07 NOTE — H&P (Signed)
History and Physical    Gabriel Becker ZOX:[id number] DOB: 04-05-1951 DOA: 10/07/2022  PCP: Pcp, No  Patient coming from: Home  I have personally briefly reviewed patient's old medical records in [facility]  Chief Complaint: Failure to thrive, inability to care for self, frequent falls  HPI: Gabriel Becker is a 72 y.o. male with medical history significant for CAD s/p STEMI and DES dRCA 2010, HTN, HLD, alcohol use who presented to the ED from home by EMS/PD under IVC for evaluation of multiple falls at home, inability to care for self, as well as refusal of care from family.  Unable to obtain history from patient as he is sedated after receiving IV Ativan and is otherwise supplemented by EDP and chart review.  Patient is coming from home.  Reportedly his father passed away few months ago.  Since then he has been on a downward spiral and not caring for himself.  Family states that he has not been eating and has been refusing help.  He has been drinking alcohol but unclear how often or when last drink was.  He has been following multiple times at home.  Patient reportedly fell off the couch multiple times in the last day.  Family placed him back on the couch and afterwards patient requested them to leave and refused any further help.  Family requested IVC from PD.  Patient was brought to the ED with IVC paperwork in place.  It is unclear if he has had any regular medical follow-up or if he has been taking any of his previous medications.  Most recent records in our system are from 2016.  ED Course  Labs/Imaging on admission: I have personally reviewed following labs and imaging studies.  Initial vitals showed BP 164/118, pulse 108, RR 22, temp 98.4 F, SpO2 98% on room air.  Labs show WBC 10.0, hemoglobin 14.8, platelets 201,000, sodium 147, potassium 3.9, bicarb 24, BUN 63, creatinine 1.84, serum glucose 147, AST 52, ALT 17, alk phos 63, total bilirubin 1.2, magnesium 2.5, CK 2363, lactic  acid 2.9, troponin 168 > 171, serum ethanol <10.  Urinalysis and UDS pending collection.  Formal chest x-ray negative for focal consolidation, edema, effusion.  Pelvic x-ray negative for evidence of left hip fracture, right hip imaging limited due to patient rotation.  CT head without contrast negative for acute intracranial abnormality.  CT cervical spine without contrast shows moderate degenerative changes throughout the C-spine without acute fracture or subluxation.  Patient was given 1 L LR, IV thiamine, placed on CIWA protocol and given 1 mg IV Ativan.  Patient noted to be violent with staff and refusing medications while in the ED. Of note per ED MD documentation patient was AAOx3 on arrival however not compliant with exam and history taking.  The hospitalist service was consulted to admit for further evaluation and management.  Review of Systems:  Unable to obtain full review of systems due to sedation.   Past Medical History:  Diagnosis Date   Alcohol abuse    CAD (coronary artery disease)    a. inf STEMI 01/2009 => LHC - dRCA occl (tx with Xience 2.8x18 mm DES), pLAD 30, PL1 70, PL2 80, EF 55%   Hyperlipidemia    Hypertension    ST elevation myocardial infarction (STEMI) of true posterior wall [facility])     Social History:  reports that he quit smoking about 14 years ago. His smoking use included cigarettes. He has a 42.00 pack-year smoking history. He does  not have any smokeless tobacco history on file. He reports current alcohol use of about 1.0 standard drink of alcohol per week. He reports that he does not use drugs.  No Known Allergies  Family History  Problem Relation Age of Onset   Cancer Father 37       unknown cancer   Coronary artery disease Other        siblings   Cancer Sister        x2, unknown   CVA Mother 19     Prior to Admission medications   Medication Sig Start Date End Date Taking? Authorizing Provider  amLODipine (NORVASC) 10 MG tablet TAKE 1  TABLET (10 MG TOTAL) BY MOUTH DAILY. 04/14/15   Laurey Morale, MD  aspirin 81 MG tablet Take 1 tablet (81 mg total) by mouth daily. 02/15/11   Kathleene Hazel, MD  atorvastatin (LIPITOR) 80 MG tablet TAKE 1 TABLET (80 MG TOTAL) BY MOUTH DAILY. 01/25/15   Kathleene Hazel, MD  hydrALAZINE (APRESOLINE) 25 MG tablet Take 1 tablet (25 mg total) by mouth 3 (three) times daily. 09/30/13   Tereso Newcomer T, PA-C  ibuprofen (ADVIL,MOTRIN) 200 MG tablet Take 200 mg by mouth 2 (two) times daily.      [provider]  losartan-hydrochlorothiazide (HYZAAR) 100-25 MG per tablet Take 1 tablet by mouth daily. 09/13/14   Shelva Majestic, MD  metoprolol succinate (TOPROL-XL) 50 MG 24 hr tablet Take 1 tablet (50 mg total) by mouth daily. Take with or immediately following a meal. 09/30/14   Shelva Majestic, MD  Multiple Vitamins-Minerals (MULTIVITAMIN WITH MINERALS) tablet Take 1 tablet by mouth daily.    [provider]  nitroGLYCERIN (NITROSTAT) 0.4 MG SL tablet Place 0.4 mg under the tongue every 5 (five) minutes as needed. Take 1 tablet under tongue at onset of chest pain; you may repeat every 5 minutes for up to 3 doses.     [provider]    Physical Exam: Vitals:   10/07/22 1630 10/07/22 1700 10/07/22 1800 10/07/22 1830  BP: 95/66 105/84 (!) 145/110 (!) 163/115  Pulse:  100 (!) 110 (!) 111  Resp: 17 17 17 17   Temp:      TempSrc:      SpO2: 99% 98% 96% 97%  Weight:      Height:       Exam limited due to sedation. Constitutional: Chronically ill-appearing thin man resting in bed, somnolent and difficult to arouse Eyes: PERRL, lids and conjunctivae normal ENMT: Mucous membranes are dry. Posterior pharynx clear of any exudate or lesions.poor dentition.  Neck: normal, supple, no masses. Respiratory: clear to auscultation anteriorly. Normal respiratory effort. No accessory muscle use.  Cardiovascular: Regular rate and rhythm, no murmurs / rubs / gallops. No  extremity edema. 2+ pedal pulses. Abdomen: no tenderness elicited with palpation, no masses palpated. Musculoskeletal: no clubbing / cyanosis.  Thin extremities with muscle wasting throughout Skin: no rashes, lesions, ulcers. No induration Neurologic: Limited exam due to sedation, grimaces and withdraws to noxious stimuli  Psychiatric: Somnolent, difficult to arouse.  He was reportedly violent and verbally abusive to staff on arrival to the ED.  EKG: Personally reviewed. Sinus tachycardia, rate 122, PVCs present, motion artifact throughout.  Assessment/Plan Principal Problem:   Non-traumatic rhabdomyolysis Active Problems:   Hyperlipidemia   Essential hypertension   CAD (coronary artery disease)   Alcohol abuse   Renal insufficiency   Involuntary commitment   Failure to thrive in adult  Gabriel Becker is a 72 y.o. male with medical history significant for CAD s/p STEMI and DES dRCA 2010, HTN, HLD, alcohol use who is admitted with failure to thrive complicated by rhabdomyolysis, AKI, and volume depletion.  Presented under IVC paperwork.  Likely will need psychiatry evaluation once more medically optimized.  Assessment and Plan: Rhabdomyolysis: Reported to have multiple falls at home and immobility.  CK 2363 on admission. -Continue IV fluid hydration overnight -Repeat CK level in a.m.  Acute kidney injury versus chronic renal insufficiency: BUN 63 with creatinine 1.84 on arrival in setting of rhabdomyolysis.  No recent baseline labs for comparison. -Continue IV fluid hydration overnight -Obtain urinalysis -Monitor UOP and repeat labs in a.m.  CAD s/p DES dRCA 2010 Elevated troponin: Troponin 168 > 171.  Patient did not report any chest pain to prior providers.  EKG shows sinus tachycardia.  Suspect demand ischemia in setting of significant volume depletion with rhabdomyolysis and AKI.  Unclear if he has been taking any of his cardiac meds, he refused oral meds while in the  ED. -Keep on telemetry -Can start back on aspirin and beta-blocker when patient more cooperative -Hold statin with elevated CK  Hypernatremia: Mild in setting of volume depletion.  Started on half-normal saline infusion overnight.  Lactic acidosis: Secondary to volume depletion.  Continue IV fluid hydration.  Alcohol use: History of alcohol use, not clear how often or when last drink was.  Serum ethanol undetectable.  He has been placed on CIWA protocol. -Continue CIWA protocol with Ativan as needed -Continue thiamine, folate, MVM -Check B1 level  Failure to thrive Refusal of care -placed under IVC: Reportedly progressive failure to thrive after the passing of his father several months ago.  Not caring for self and has refused care from family.  He was brought to the ED under IVC and has been violent and verbally aggressive towards staff.  Will need psychiatry evaluation once above medical issues improved. -Placed under IVC -Continue management as above -Check B1, B12, TSH levels -Psychiatry consult when medically optimized   DVT prophylaxis: enoxaparin (LOVENOX) injection 30 mg Start: 10/07/22 1915 Code Status: Full code Family Communication: None present on admission Disposition Plan: From home, dispo pending clinical progress Consults called: None Severity of Illness: The appropriate patient status for this patient is INPATIENT. Inpatient status is judged to be reasonable and necessary in order to provide the required intensity of service to ensure the patient's safety. The patient's presenting symptoms, physical exam findings, and initial radiographic and laboratory data in the context of their chronic comorbidities is felt to place them at high risk for further clinical deterioration. Furthermore, it is not anticipated that the patient will be medically stable for discharge from the hospital within 2 midnights of admission.   * I certify that at the point of admission it is my  clinical judgment that the patient will require inpatient hospital care spanning beyond 2 midnights from the point of admission due to high intensity of service, high risk for further deterioration and high frequency of surveillance required.Darreld Mclean MD [facility]  If 7PM-7AM, please contact night-coverage [email or web address]  10/07/2022, 7:27 PM

## 2022-10-07 NOTE — ED Notes (Signed)
Staffing contacted for sitter. Was informed no sitters available at this time. Staffing informed of assigned room number and stated they will send one if available.

## 2022-10-07 NOTE — ED Notes (Signed)
Pt will not take any oral medications.

## 2022-10-07 NOTE — ED Notes (Signed)
Pt being violent with staff at this time and not cooperating with interventions and monitoring.

## 2022-10-07 NOTE — ED Triage Notes (Signed)
PT BIB EMS from home for failure to thrive. Pt's family reports pt is not eating, drinking or caring for self since his father's passing. Per Ems pt fell off of couch multiple times, family put pt back on couch and then requested to leave  him alone. After 24 hours of refusal of care family requested IVC from PD, paperwork with MD. A&Ox3-place.   EMS VS 130 HR 150/110 Cap 25 30 RR BG 164 97.35F   20 L AC 150 NS

## 2022-10-07 NOTE — Progress Notes (Signed)
Overnight event  Patient tachycardic to the 200s and review of telemetry showing sinus tachycardia versus SVT.  Heart rate now improved to 120-130s, sinus rhythm.  Patient resting comfortably and has no complaints.  Appears quite dehydrated with dry mucous membranes and decreased skin turgor.  Blood pressure stable.  Afebrile and not hypoxic.  Will give IV Lopressor and continue IV fluid hydration.  No hypokalemia or hypomagnesemia on labs done this afternoon.  Continue cardiac monitoring.  TSH and UDS pending.  Monitor BMP given mild hypernatremia on initial labs.

## 2022-10-07 NOTE — ED Notes (Signed)
Pt back from CT

## 2022-10-07 NOTE — ED Notes (Signed)
ED TO INPATIENT HANDOFF REPORT  ED Nurse Name and Phone #: Juliette Alcide RN [phone]  S Name/Age/Gender Gabriel Becker 72 y.o. male Room/Bed: 007C/007C  Code Status   Code Status: Full Code  Home/SNF/Other Home? Pt came from home but unsure if he will be d/c'd to home or SNF. Pt alert but unable to assess orientation due to pt refusing to answer questions. Is this baseline? No   Triage Complete: Triage complete  Chief Complaint Non-traumatic rhabdomyolysis [M62.82]  Triage Note PT BIB EMS from home for failure to thrive. Pt's family reports pt is not eating, drinking or caring for self since his father's passing. Per Ems pt fell off of couch multiple times, family put pt back on couch and then requested to leave  him alone. After 24 hours of refusal of care family requested IVC from PD, paperwork with MD. A&Ox3-place.   EMS VS 130 HR 150/110 Cap 25 30 RR BG 164 97.64F   20 L AC 150 NS   Allergies No Known Allergies  Level of Care/Admitting Diagnosis ED Disposition     ED Disposition  Admit   Condition  --   Comment  Hospital [facility] [[id number]]  Level of Care: Progressive [102]  Admit to Progressive based on following criteria: ACUTE MENTAL DISORDER-RELATED Drug/Alcohol Ingestion/Overdose/Withdrawal, Suicidal Ideation/attempt requiring safety sitter and < Q2h monitoring/assessments, moderate to severe agitation that is managed with medication/sitter, CIWA-Ar score < 20.  May admit patient to [name] or [facility] if equivalent level of care is available:: No  Covid Evaluation: Asymptomatic - no recent exposure (last 10 days) testing not required  Diagnosis: Non-traumatic rhabdomyolysis [2841324]  Admitting Physician: Charlsie Quest [[id number]]  Attending Physician: Charlsie Quest [[id number]]  Certification:: I certify this patient will need inpatient services for at least 2 midnights  Estimated Length of Stay: 2           B Medical/Surgery History Past Medical History:  Diagnosis Date   Alcohol abuse    CAD (coronary artery disease)    a. inf STEMI 01/2009 => LHC - dRCA occl (tx with Xience 2.8x18 mm DES), pLAD 30, PL1 70, PL2 80, EF 55%   Hyperlipidemia    Hypertension    ST elevation myocardial infarction (STEMI) of true posterior wall (HCC)       A IV Location/Drains/Wounds Patient Lines/Drains/Airways Status     Active Line/Drains/Airways     Name Placement date Placement time Site Days   Peripheral IV 10/07/22 20 G Left Antecubital 10/07/22  1333  Antecubital  less than 1            Intake/Output Last 24 hours  Intake/Output Summary (Last 24 hours) at 10/07/2022 1930 Last data filed at 10/07/2022 1550 Gross per 24 hour  Intake 1150 ml  Output --  Net 1150 ml    Labs/Imaging Results for orders placed or performed during the hospital encounter of 10/07/22 (from the past 48 hour(s))  Comprehensive metabolic panel     Status: Abnormal   Collection Time: 10/07/22  2:23 PM  Result Value Ref Range   Sodium 147 (H) 135 - 145 mmol/L   Potassium 3.9 3.5 - 5.1 mmol/L    Comment: HEMOLYSIS AT THIS LEVEL MAY AFFECT RESULT   Chloride 100 98 - 111 mmol/L   CO2 24 22 - 32 mmol/L   Glucose, Bld 147 (H) 70 - 99 mg/dL    Comment: Glucose reference range applies only to samples taken after fasting  for at least 8 hours.   BUN 63 (H) 8 - 23 mg/dL   Creatinine, Ser 4.09 (H) 0.61 - 1.24 mg/dL   Calcium 81.1 8.9 - 91.4 mg/dL   Total Protein 7.4 6.5 - 8.1 g/dL   Albumin 3.6 3.5 - 5.0 g/dL   AST 52 (H) 15 - 41 U/L    Comment: HEMOLYSIS AT THIS LEVEL MAY AFFECT RESULT   ALT 17 0 - 44 U/L    Comment: HEMOLYSIS AT THIS LEVEL MAY AFFECT RESULT   Alkaline Phosphatase 63 38 - 126 U/L   Total Bilirubin 1.2 0.3 - 1.2 mg/dL    Comment: HEMOLYSIS AT THIS LEVEL MAY AFFECT RESULT   GFR, Estimated 38 (L) >60 mL/min    Comment: (NOTE) Calculated using the CKD-EPI Creatinine Equation (2021)    Anion  gap 23 (H) 5 - 15    Comment: ELECTROLYTES REPEATED TO VERIFY Performed at [facility], [address] [place], [place] [address]   Ethanol     Status: None   Collection Time: 10/07/22  2:23 PM  Result Value Ref Range   Alcohol, Ethyl (B) <10 <10 mg/dL    Comment: (NOTE) Lowest detectable limit for serum alcohol is 10 mg/dL.  For medical purposes only. Performed at [facility], [address] [place], [place] [address]   CBC with Differential     Status: Abnormal   Collection Time: 10/07/22  2:23 PM  Result Value Ref Range   WBC 10.0 4.0 - 10.5 K/uL   RBC 4.75 4.22 - 5.81 MIL/uL   Hemoglobin 14.8 13.0 - 17.0 g/dL   HCT 86.5 78.4 - 69.6 %   MCV 95.8 80.0 - 100.0 fL   MCH 31.2 26.0 - 34.0 pg   MCHC 32.5 30.0 - 36.0 g/dL   RDW 29.5 28.4 - 13.2 %   Platelets 201 150 - 400 K/uL   nRBC 0.2 0.0 - 0.2 %   Neutrophils Relative % 77 %   Neutro Abs 7.8 (H) 1.7 - 7.7 K/uL   Lymphocytes Relative 15 %   Lymphs Abs 1.5 0.7 - 4.0 K/uL   Monocytes Relative 7 %   Monocytes Absolute 0.7 0.1 - 1.0 K/uL   Eosinophils Relative 0 %   Eosinophils Absolute 0.0 0.0 - 0.5 K/uL   Basophils Relative 0 %   Basophils Absolute 0.0 0.0 - 0.1 K/uL   Immature Granulocytes 1 %   Abs Immature Granulocytes 0.06 0.00 - 0.07 K/uL    Comment: Performed at [facility], [address] [place], [place] [address]  Troponin I (High Sensitivity)     Status: Abnormal   Collection Time: 10/07/22  2:23 PM  Result Value Ref Range   Troponin I (High Sensitivity) 168 (HH) <18 ng/L    Comment: CRITICAL RESULT CALLED TO, READ BACK BY AND VERIFIED WITH M DOSS,RN 1543 10/07/2022 WBOND (NOTE) Elevated high sensitivity troponin I (hsTnI) values and significant  changes across serial measurements may suggest ACS but many other  chronic and acute conditions are known to elevate hsTnI results.  Refer to the "Links" section for chest pain algorithms and additional  guidance. Performed at [facility], [address], [place], [place] [address]   Magnesium     Status: Abnormal   Collection Time: 10/07/22  2:23 PM  Result Value Ref Range   Magnesium 2.6 (H) 1.7 - 2.4 mg/dL    Comment: Performed at [facility], [address] [place], [place] [address]  CK     Status: Abnormal   Collection Time: 10/07/22  2:23 PM  Result Value Ref Range   Total CK 2,363 (H) 49 - 397 U/L    Comment: HEMOLYSIS AT THIS LEVEL MAY AFFECT RESULT Performed at [facility], [address], [place], [place] [address]   Lactic acid, plasma     Status: Abnormal   Collection Time: 10/07/22  4:03 PM  Result Value Ref Range   Lactic Acid, Venous 2.9 (HH) 0.5 - 1.9 mmol/L    Comment: CRITICAL RESULT CALLED TO, READ BACK BY AND VERIFIED WITH C KHOURI,RN 1655 10/07/2022 WBOND Performed at [facility], [address] [place], [place] [address]   Troponin I (High Sensitivity)     Status: Abnormal   Collection Time: 10/07/22  4:03 PM  Result Value Ref Range   Troponin I (High Sensitivity) 171 (HH) <18 ng/L    Comment: CRITICAL VALUE NOTED. VALUE IS CONSISTENT WITH PREVIOUSLY REPORTED/CALLED VALUE (NOTE) Elevated high sensitivity troponin I (hsTnI) values and significant  changes across serial measurements may suggest ACS but many other  chronic and acute conditions are known to elevate hsTnI results.  Refer to the "Links" section for chest pain algorithms and additional  guidance. Performed at [facility], [address] [place], [place] [address]    CT Head Wo Contrast  Result Date: 10/07/2022 CLINICAL DATA:  Falls, failure to thrive. EXAM: CT HEAD WITHOUT CONTRAST TECHNIQUE: Contiguous axial images were obtained from the base of the skull through the vertex without intravenous contrast. RADIATION DOSE REDUCTION: This exam was performed according to the departmental dose-optimization program which includes automated exposure control, adjustment of the mA and/or kV according  to patient size and/or use of iterative reconstruction technique. COMPARISON:  None Available. FINDINGS: Brain: No intracranial hemorrhage, mass effect, or midline shift. Moderate generalized atrophy. No hydrocephalus. The basilar cisterns are patent. Moderate to advanced chronic small vessel ischemia. Small remote lacunar infarcts in the basal ganglia. No evidence of territorial infarct or acute ischemia. No extra-axial or intracranial fluid collection. Vascular: Atherosclerosis of skullbase vasculature without hyperdense vessel or abnormal calcification. Skull: No fracture or focal lesion. Sinuses/Orbits: No acute findings. Tiny mucous retention cysts in the maxillary sinuses. No mastoid effusion. Other: None. IMPRESSION: 1. No acute intracranial abnormality. 2. Moderate atrophy and chronic small vessel ischemia. Electronically Signed   By: Narda Rutherford M.D.   On: 10/07/2022 16:45   CT Cervical Spine Wo Contrast  Result Date: 10/07/2022 CLINICAL DATA:  Falls and failure to thrive EXAM: CT CERVICAL SPINE WITHOUT CONTRAST TECHNIQUE: Multidetector CT imaging of the cervical spine was performed without intravenous contrast. Multiplanar CT image reconstructions were also generated. RADIATION DOSE REDUCTION: This exam was performed according to the departmental dose-optimization program which includes automated exposure control, adjustment of the mA and/or kV according to patient size and/or use of iterative reconstruction technique. COMPARISON:  None Available. FINDINGS: Alignment: Straightening of normal lordosis. No traumatic subluxation. Skull base and vertebrae: No acute fracture. Vertebral body heights are maintained. The dens and skull base are intact. Soft tissues and spinal canal: No prevertebral fluid or swelling. No visible canal hematoma. Disc levels: Moderate diffuse degenerative disc disease with large anterior spurs. Mild to moderate multilevel facet hypertrophy. No high-grade canal stenosis.  Upper chest: No acute findings. Other: None. IMPRESSION: Moderate degenerative change throughout the cervical spine without acute fracture or subluxation. Electronically Signed   By: Shawna Orleans  Sanford M.D.   On: 10/07/2022 16:43   DG Pelvis Portable  Result Date: 10/07/2022 CLINICAL DATA:  Fall EXAM: PORTABLE PELVIS 1 VIEWS COMPARISON:  None Available. FINDINGS: Limited evaluation of the right hip due to patient rotation, additionally portions of the proximal right femur and right pelvis are excluded from the field of view. No evidence of left hip fracture. Degenerative changes of the partially visualized lumbar spine. Mild degenerative changes of the left hip. Vascular calcifications. Soft tissues are unremarkable. IMPRESSION: 1. Limited evaluation of the right hip due to patient rotation, additionally portions of the proximal right femur and right pelvis are excluded from the field of view. Consider repeat imaging. 2. No evidence of left hip fracture. Electronically Signed   By: Allegra Lai M.D.   On: 10/07/2022 15:20   DG Chest Portable 1 View  Result Date: 10/07/2022 CLINICAL DATA:  Fall EXAM: PORTABLE CHEST 1 VIEW COMPARISON:  Chest x-ray dated August 26, 2013 FINDINGS: Patient rotation somewhat limits evaluation. Cardiac and mediastinal contours within normal limits. Lungs are clear. No evidence of pleural effusion or pneumothorax. No displaced fracture. IMPRESSION: No acute cardiopulmonary process. Electronically Signed   By: Allegra Lai M.D.   On: 10/07/2022 15:17    Pending Labs Unresulted Labs (From admission, onward)     Start     Ordered   10/08/22 0500  TSH  Tomorrow morning,   R        10/07/22 1914   10/08/22 0500  CBC  Tomorrow morning,   R        10/07/22 1914   10/08/22 0500  Comprehensive metabolic panel  Tomorrow morning,   R        10/07/22 1914   10/08/22 0500  CK  Tomorrow morning,   R        10/07/22 1914   10/08/22 0500  Vitamin B12  Tomorrow morning,   R         10/07/22 1914   10/08/22 0500  Vitamin B1  Tomorrow morning,   R        10/07/22 1917   10/07/22 1433  Lactic acid, plasma  Now then every 2 hours,   R (with STAT occurrences)      10/07/22 1433   10/07/22 1433  Rapid urine drug screen (hospital performed)  ONCE - STAT,   STAT        10/07/22 1433   10/07/22 1432  Urinalysis, Routine w reflex microscopic -Urine, Clean Catch  Once,   URGENT       Question:  Specimen Source  Answer:  Urine, Clean Catch   10/07/22 1433            Vitals/Pain Today's Vitals   10/07/22 1630 10/07/22 1700 10/07/22 1800 10/07/22 1830  BP: 95/66 105/84 (!) 145/110 (!) 163/115  Pulse:  100 (!) 110 (!) 111  Resp: 17 17 17 17   Temp:      TempSrc:      SpO2: 99% 98% 96% 97%  Weight:      Height:      PainSc:        Isolation Precautions No active isolations  Medications Medications  LORazepam (ATIVAN) tablet 1-4 mg ( Oral See Alternative 10/07/22 1556)    Or  LORazepam (ATIVAN) injection 1-4 mg (1 mg Intravenous Given 10/07/22 1556)  folic acid (FOLVITE) tablet 1 mg (1 mg Oral Not Given 10/07/22 1556)  multivitamin with minerals tablet 1 tablet (1 tablet Oral Not Given  10/07/22 1556)  enoxaparin (LOVENOX) injection 30 mg (has no administration in time range)  sodium chloride flush (NS) 0.9 % injection 3 mL (has no administration in time range)  0.45 % sodium chloride infusion (has no administration in time range)  acetaminophen (TYLENOL) tablet 650 mg (has no administration in time range)    Or  acetaminophen (TYLENOL) suppository 650 mg (has no administration in time range)  ondansetron (ZOFRAN) tablet 4 mg (has no administration in time range)    Or  ondansetron (ZOFRAN) injection 4 mg (has no administration in time range)  senna-docusate (Senokot-S) tablet 1 tablet (has no administration in time range)  thiamine (VITAMIN B1) injection 100 mg (has no administration in time range)  thiamine (VITAMIN B1) injection 100 mg (100 mg Intravenous  Given 10/07/22 1438)  lactated ringers bolus 1,000 mL (0 mLs Intravenous Stopped 10/07/22 1550)    Mobility Pt is normally ambulatory but with frequent falls. Pt currently refuses to get up.     Focused Assessments Neuro Assessment Handoff:  Swallow screen pass? Yes  Cardiac Rhythm: Normal sinus rhythm       Neuro Assessment: Exceptions to WDL    R Recommendations: See Admitting Provider Note  Report given to:   Additional Notes: Per notes, Pt is A&Ox3. Currently, pt is alert but not speaking; unable to determine orientation at this time. Pt not taking care of himself at home and IVC'd by family. Previous RN reports that pt has refused all PO meds.

## 2022-10-07 NOTE — ED Notes (Signed)
IVC Paperwork Completed. EXP:10/14/22 1 copy faxed to [facility], 1 copy labeled and in medrec drawer, 3 copies on purple clip board at NS station in green.  Original in red Personal assistant. CASE NUMBER: [id number]

## 2022-10-07 NOTE — ED Provider Notes (Signed)
[place] EMERGENCY DEPARTMENT AT [facility] Provider Note   CSN: [id number] Arrival date & time: 10/07/22  1356     History  Chief Complaint  Patient presents with   Failure To Thrive    Gabriel Becker is a 72 y.o. male.  With PMH of HTN, alcohol abuse brought in by EMS from family under IVC filed by family for concerns of inability to care for self, multiple falls at home and failure to thrive.  On arrival, patient is AAOx3 however he is not very compliant with exam and history taking.  He tells me to fuck off and that he does not want my help and felt his family as well.  He says he will drink alcohol and liquor here and there.  He is denying any medical complaints.  Per EMS who brought IVC paperwork from family, patient has been unable to care for himself since the passing of his father a few months prior.  He has not been eating or drinking.  He used to drink alcohol every day but stopped around April.  He has been falling multiple times and found on the ground multiple times by family covered in feces and urine.  HPI     Home Medications Prior to Admission medications   Medication Sig Start Date End Date Taking? Authorizing Provider  amLODipine (NORVASC) 10 MG tablet TAKE 1 TABLET (10 MG TOTAL) BY MOUTH DAILY. 04/14/15   Laurey Morale, MD  aspirin 81 MG tablet Take 1 tablet (81 mg total) by mouth daily. 02/15/11   Kathleene Hazel, MD  atorvastatin (LIPITOR) 80 MG tablet TAKE 1 TABLET (80 MG TOTAL) BY MOUTH DAILY. 01/25/15   Kathleene Hazel, MD  hydrALAZINE (APRESOLINE) 25 MG tablet Take 1 tablet (25 mg total) by mouth 3 (three) times daily. 09/30/13   Tereso Newcomer T, PA-C  ibuprofen (ADVIL,MOTRIN) 200 MG tablet Take 200 mg by mouth 2 (two) times daily.      [provider]  losartan-hydrochlorothiazide (HYZAAR) 100-25 MG per tablet Take 1 tablet by mouth daily. 09/13/14   Shelva Majestic, MD  metoprolol succinate (TOPROL-XL) 50 MG 24 hr tablet  Take 1 tablet (50 mg total) by mouth daily. Take with or immediately following a meal. 09/30/14   Shelva Majestic, MD  Multiple Vitamins-Minerals (MULTIVITAMIN WITH MINERALS) tablet Take 1 tablet by mouth daily.    [provider]  nitroGLYCERIN (NITROSTAT) 0.4 MG SL tablet Place 0.4 mg under the tongue every 5 (five) minutes as needed. Take 1 tablet under tongue at onset of chest pain; you may repeat every 5 minutes for up to 3 doses.     [provider]      Allergies    Patient has no known allergies.    Review of Systems   Review of Systems  Physical Exam Updated Vital Signs BP (!) 164/118 (BP Location: Right Arm)   Pulse (!) 126   Temp 98.4 F (36.9 C) (Oral)   Resp (!) 22   Ht 5\' 10"  (1.778 m)   Wt 73.5 kg   SpO2 98%   BMI 23.25 kg/m  Physical Exam Constitutional: Alert and oriented.x3.  Cachectic and chronically ill in appearance Eyes: PERRL ENT      Head: Normocephalic and atraumatic.      Mouth/Throat: Mucous membranes are dry with thrush on tongue. Cardiovascular: Tachycardic Respiratory: Normal respiratory effort. Breath sounds are normal.  O2 sat 98 on RA Gastrointestinal: Soft, thin, nontender Musculoskeletal:  thin, cachectic extremities Neurologic: Normal speech and language. AAOx3.  Moving all 4 extremities.  Sensation grossly intact. Skin: Skin is cool, dry Psychiatric: Slightly agitated  ED Results / Procedures / Treatments   Labs (all labs ordered are listed, but only abnormal results are displayed) Labs Reviewed - No data to display  EKG None  Radiology No results found.  Procedures .Critical Care  Performed by: Mardene Sayer, MD Authorized by: Mardene Sayer, MD   Critical care provider statement:    Critical care time (minutes):  40   Critical care was necessary to treat or prevent imminent or life-threatening deterioration of the following conditions:  Metabolic crisis and renal failure   Critical care was time  spent personally by me on the following activities:  Development of treatment plan with patient or surrogate, evaluation of patient's response to treatment, examination of patient, ordering and review of laboratory studies, ordering and review of radiographic studies, ordering and performing treatments and interventions, pulse oximetry, re-evaluation of patient's condition, review of old charts and obtaining history from patient or surrogate     Medications Ordered in ED Medications - No data to display  ED Course/ Medical Decision Making/ A&P                             Medical Decision Making Gabriel Becker is a 72 y.o. male.  With PMH of HTN, alcohol abuse brought in by EMS from family under IVC filed by family for concerns of inability to care for self, multiple falls at home and failure to thrive.    Patient is awake alert oriented x 3 but verbally aggressive and did not want history taking or physical exam.  There is no evidence of acute traumatic injuries on exam\ ]u7  CT head reviewed by me no ICH.  Chest x-ray reviewed by me no pneumothorax or pneumonia.  He appears to be neurologically intact and not concern for CVA.  He appears very dry on exam consistent with decreased p.o. intake as well as cachectic.  His labs are concerning for metabolic abnormalities likely related to hypovolemia including hyponatremia 147 elevated BUN 63 creatinine 1.84.  Lactate 2.9.  EKG reviewed by me sinus tach with PACs and nonspecific ST/T changes however improved from prior with elevated troponin 160 and 171 likely demand ischemia from rhabdomyolysis, CKD, dehydration.  No chest pain reported.  CK was in the 2000's.  Started patient on IV fluid repletion, ordered for p.o. antihypertensive meds however patient was refusing and would not take.  Also start patient on CIWA protocol and thiamine repletion.  Plan for admission.  Amount and/or Complexity of Data Reviewed Labs: ordered. Radiology:  ordered.  Risk Prescription drug management. Decision regarding hospitalization.      Final Clinical Impression(s) / ED Diagnoses Final diagnoses:  None    Rx / DC Orders ED Discharge Orders     None         Mardene Sayer, MD 10/07/22 1930

## 2022-10-07 NOTE — Progress Notes (Signed)
Yellow MEWS due to b/p and pulse. Consulting civil engineer, CNA, Comptroller, and [facility] MD all notified.

## 2022-10-08 ENCOUNTER — Inpatient Hospital Stay (HOSPITAL_COMMUNITY): Payer: Medicare Other

## 2022-10-08 DIAGNOSIS — I10 Essential (primary) hypertension: Secondary | ICD-10-CM

## 2022-10-08 DIAGNOSIS — I4891 Unspecified atrial fibrillation: Secondary | ICD-10-CM

## 2022-10-08 DIAGNOSIS — I5020 Unspecified systolic (congestive) heart failure: Secondary | ICD-10-CM | POA: Diagnosis not present

## 2022-10-08 DIAGNOSIS — E785 Hyperlipidemia, unspecified: Secondary | ICD-10-CM | POA: Diagnosis not present

## 2022-10-08 DIAGNOSIS — F1010 Alcohol abuse, uncomplicated: Secondary | ICD-10-CM | POA: Diagnosis not present

## 2022-10-08 DIAGNOSIS — M6282 Rhabdomyolysis: Secondary | ICD-10-CM

## 2022-10-08 DIAGNOSIS — R627 Adult failure to thrive: Secondary | ICD-10-CM

## 2022-10-08 LAB — COMPREHENSIVE METABOLIC PANEL
ALT: 16 U/L (ref 0–44)
AST: 63 U/L — ABNORMAL HIGH (ref 15–41)
Albumin: 2.9 g/dL — ABNORMAL LOW (ref 3.5–5.0)
Alkaline Phosphatase: 53 U/L (ref 38–126)
Anion gap: 20 — ABNORMAL HIGH (ref 5–15)
BUN: 59 mg/dL — ABNORMAL HIGH (ref 8–23)
CO2: 27 mmol/L (ref 22–32)
Calcium: 9.4 mg/dL (ref 8.9–10.3)
Chloride: 104 mmol/L (ref 98–111)
Creatinine, Ser: 1.52 mg/dL — ABNORMAL HIGH (ref 0.61–1.24)
GFR, Estimated: 48 mL/min — ABNORMAL LOW (ref >60)
Glucose, Bld: 124 mg/dL — ABNORMAL HIGH (ref 70–99)
Potassium: 2.9 mmol/L — ABNORMAL LOW (ref 3.5–5.1)
Sodium: 151 mmol/L — ABNORMAL HIGH (ref 135–145)
Total Bilirubin: 1 mg/dL (ref 0.3–1.2)
Total Protein: 6.3 g/dL — ABNORMAL LOW (ref 6.5–8.1)

## 2022-10-08 LAB — CBC
HCT: 42.3 % (ref 39.0–52.0)
Hemoglobin: 13.7 g/dL (ref 13.0–17.0)
MCH: 31.4 pg (ref 26.0–34.0)
MCHC: 32.4 g/dL (ref 30.0–36.0)
MCV: 96.8 fL (ref 80.0–100.0)
Platelets: 159 10*3/uL (ref 150–400)
RBC: 4.37 MIL/uL (ref 4.22–5.81)
RDW: 13.9 % (ref 11.5–15.5)
WBC: 8.3 10*3/uL (ref 4.0–10.5)
nRBC: 0.2 % (ref 0.0–0.2)

## 2022-10-08 LAB — BASIC METABOLIC PANEL
Anion gap: 15 (ref 5–15)
BUN: 58 mg/dL — ABNORMAL HIGH (ref 8–23)
CO2: 25 mmol/L (ref 22–32)
Calcium: 8.9 mg/dL (ref 8.9–10.3)
Chloride: 108 mmol/L (ref 98–111)
Creatinine, Ser: 1.48 mg/dL — ABNORMAL HIGH (ref 0.61–1.24)
GFR, Estimated: 50 mL/min — ABNORMAL LOW (ref >60)
Glucose, Bld: 118 mg/dL — ABNORMAL HIGH (ref 70–99)
Potassium: 2.8 mmol/L — ABNORMAL LOW (ref 3.5–5.1)
Sodium: 148 mmol/L — ABNORMAL HIGH (ref 135–145)

## 2022-10-08 LAB — URINALYSIS, ROUTINE W REFLEX MICROSCOPIC
Glucose, UA: NEGATIVE mg/dL
Ketones, ur: 5 mg/dL — AB
Leukocytes,Ua: NEGATIVE
Nitrite: NEGATIVE
Protein, ur: 100 mg/dL — AB
Specific Gravity, Urine: 1.025 (ref 1.005–1.030)
pH: 5 (ref 5.0–8.0)

## 2022-10-08 LAB — ECHOCARDIOGRAM COMPLETE
AR max vel: 1.84 cm2
AV Area VTI: 2.17 cm2
AV Area mean vel: 1.85 cm2
AV Mean grad: 2 mmHg
AV Peak grad: 4.2 mmHg
Ao pk vel: 1.02 m/s
Area-P 1/2: 6.63 cm2
Calc EF: 20.6 %
Height: 70 in
MV VTI: 2.18 cm2
P 1/2 time: 573 msec
Single Plane A2C EF: 20.9 %
Single Plane A4C EF: 18.7 %
Weight: 2613.77 oz

## 2022-10-08 LAB — RAPID URINE DRUG SCREEN, HOSP PERFORMED
Amphetamines: NOT DETECTED
Barbiturates: NOT DETECTED
Benzodiazepines: NOT DETECTED
Cocaine: NOT DETECTED
Opiates: NOT DETECTED
Tetrahydrocannabinol: NOT DETECTED

## 2022-10-08 LAB — TSH: TSH: 0.84 u[IU]/mL (ref 0.350–4.500)

## 2022-10-08 LAB — VITAMIN B12: Vitamin B-12: 187 pg/mL (ref 180–914)

## 2022-10-08 LAB — GLUCOSE, CAPILLARY: Glucose-Capillary: 111 mg/dL — ABNORMAL HIGH (ref 70–99)

## 2022-10-08 LAB — CK: Total CK: 3188 U/L — ABNORMAL HIGH (ref 49–397)

## 2022-10-08 LAB — MAGNESIUM: Magnesium: 2.3 mg/dL (ref 1.7–2.4)

## 2022-10-08 LAB — HIV ANTIBODY (ROUTINE TESTING W REFLEX): HIV Screen 4th Generation wRfx: NONREACTIVE

## 2022-10-08 MED ORDER — ORAL CARE MOUTH RINSE: 15.0000 mL | OROMUCOSAL | Status: DC | PRN

## 2022-10-08 MED ORDER — METOPROLOL TARTRATE 5 MG/5ML IV SOLN
2.5000 mg | Freq: Three times a day (TID) | INTRAVENOUS | Status: DC
  Administered 2022-10-08 – 2022-10-16 (×20): 2.5 mg via INTRAVENOUS
  Filled 2022-10-08 (×20): qty 5

## 2022-10-08 MED ORDER — POTASSIUM CHLORIDE CRYS ER 20 MEQ PO TBCR
40.0000 meq | EXTENDED_RELEASE_TABLET | Freq: Once | ORAL | Status: DC
  Filled 2022-10-08: qty 2

## 2022-10-08 MED ORDER — THIAMINE HCL 100 MG/ML IJ SOLN
500.0000 mg | Freq: Two times a day (BID) | INTRAVENOUS | Status: AC
  Administered 2022-10-08 – 2022-10-10 (×6): 500 mg via INTRAVENOUS
  Filled 2022-10-08 (×6): qty 5

## 2022-10-08 MED ORDER — METOPROLOL TARTRATE 12.5 MG HALF TABLET: 12.5000 mg | ORAL_TABLET | Freq: Three times a day (TID) | ORAL | Status: DC

## 2022-10-08 MED ORDER — SODIUM CHLORIDE 0.45 % IV BOLUS
1000.0000 mL | Freq: Once | INTRAVENOUS | Status: AC
  Administered 2022-10-08: 1000 mL via INTRAVENOUS

## 2022-10-08 MED ORDER — SODIUM CHLORIDE 0.45 % IV BOLUS: 500.0000 mL | Freq: Once | INTRAVENOUS | Status: DC

## 2022-10-08 MED ORDER — DEXTROSE 5 % IV SOLN: INTRAVENOUS | Status: DC

## 2022-10-08 MED ORDER — ENSURE ENLIVE PO LIQD
237.0000 mL | Freq: Three times a day (TID) | ORAL | Status: DC
  Administered 2022-10-08 – 2022-10-09 (×2): 237 mL via ORAL

## 2022-10-08 MED ORDER — POTASSIUM CHLORIDE 10 MEQ/100ML IV SOLN
10.0000 meq | INTRAVENOUS | Status: AC
  Administered 2022-10-08 (×6): 10 meq via INTRAVENOUS
  Filled 2022-10-08 (×6): qty 100

## 2022-10-08 MED ORDER — CYANOCOBALAMIN 1000 MCG/ML IJ SOLN
1000.0000 ug | Freq: Every day | INTRAMUSCULAR | Status: DC
  Administered 2022-10-08 – 2022-10-13 (×6): 1000 ug via SUBCUTANEOUS
  Filled 2022-10-08 (×6): qty 1

## 2022-10-08 MED ORDER — THIAMINE HCL 100 MG/ML IJ SOLN: 500.0000 mg | INTRAVENOUS | Status: DC

## 2022-10-08 MED ORDER — DEXTROSE-SODIUM CHLORIDE 5-0.45 % IV SOLN: INTRAVENOUS | Status: AC

## 2022-10-08 NOTE — Progress Notes (Signed)
PROGRESS NOTE        PATIENT DETAILS Name: Gabriel Becker Age: 72 y.o. Sex: male Date of Birth: 01-28-1951 Admit Date: 10/07/2022 Admitting Physician Charlsie Quest, MD PCP:Pcp, No  Brief Summary: Patient is a 72 y.o.  male with history of CAD-s/p PCI 2010, HTN, HLD, alcohol use-who was brought to the ED by EMS/PD-under IVC by family for self-neglect/recurrent falls/refusal of care.  Apparently he was father passed several months ago-and has been on a downward spiral.  See below for further details.  Significant events: 6/10>> admit to [facility] 6/11>> PAF with RVR  Significant studies: 6/10>> CT head: No acute intracranial abnormality. 6/10>> CT C-spine: No fracture/subluxation 6/11>> TSH: Normal 6/11>> vitamin B12: 187 (low) 6/11>> vitamin B1: Pending  Significant microbiology data: None  Procedures: None  Consults: [facility]  Subjective: Awake-alert-using profanities--2 point restraints.  Noncooperative-refusing to answer questions.  No family at bedside.  Back in sinus rhythm this morning.  Objective: Vitals: Blood pressure 96/83, pulse (!) 112, temperature (!) 96 F (35.6 C), temperature source Axillary, resp. rate 17, height 5\' 10"  (1.778 m), weight 74.1 kg, SpO2 92 %.   Exam: Gen Exam:not in any distress HEENT:atraumatic, normocephalic Chest: B/L clear to auscultation anteriorly CVS:S1S2 regular Abdomen:soft non tender, non distended Extremities:no edema Neurology: Non focal-although difficult exam. Skin: no rash  Pertinent Labs/Radiology:    Latest Ref Rng & Units 10/08/2022   12:43 AM 10/07/2022    2:23 PM 10/14/2014    4:10 PM  CBC  WBC 4.0 - 10.5 K/uL 8.3  10.0  10.7   Hemoglobin 13.0 - 17.0 g/dL 96.0  45.4  09.8   Hematocrit 39.0 - 52.0 % 42.3  45.5  37.1   Platelets 150 - 400 K/uL 159  201  206     Lab Results  Component Value Date   NA 148 (H) 10/08/2022   K 2.8 (L) 10/08/2022   CL 108 10/08/2022   CO2 25  10/08/2022      Assessment/Plan: Nontraumatic rhabdomyolysis Secondary to immobility-probably recurrent falls Supportive care with IVF Follow CK trend-currently slowly downtrending  AKI Hemodynamically mediated Improving Continue to gently hydrate  Hypernatremia Secondary to poor oral intake Improving-continue to hydrate with hypotonic saline Follow electrolytes  Hypokalemia Likely due to EtOH use Replete/recheck  Paroxysmal atrial fibrillation with RVR Had a run of A-fib with RVR with hypotension this morning.  Back in sinus rhythm-BP stable.  Started oral beta-blocker-but refusing pills-will place on low-dose IV metoprolol for now TSH stable Echo pending Cardiology eval pending.  Minimally elevated troponin Demand ischemia Await echo  CAD s/p PCI to RCA 2010 Does not seem to have anginal symptoms-although poor historian-very uncooperative. Continue aspirin/beta-blocker Hold statin due to rhabdomyolysis  History of EtOH use Unclear when his last drink was Although agitated-uncooperative-he does not have any overt EtOH withdrawal symptoms Ativan per CIWA protocol  Vitamin B12 deficiency Start supplementation  Failure to thrive Self-neglect-with refusal of care/agitation Recurrent falls IVC by family PT/OT/nutrition eval Check RPR Switch to high-dose IV thiamine x 3 days-to cover for Korsakoff psychosis. Psych eval Probably will require placement  BMI Estimated body mass index is 23.44 kg/m as calculated from the following:   Height as of this encounter: 5\' 10"  (1.778 m).   Weight as of this encounter: 74.1 kg.   Code status:   Code Status: Full Code  DVT Prophylaxis: enoxaparin (LOVENOX) injection 40 mg Start: 10/07/22 1915   Family Communication: None at bedside   Disposition Plan: Status is: Inpatient Remains inpatient appropriate because: Severity of illness   Planned Discharge Destination:Skilled nursing facility   Diet: Diet Order              Diet regular Room service appropriate? Yes; Fluid consistency: Thin  Diet effective now                     Antimicrobial agents: Anti-infectives (From admission, onward)    None        MEDICATIONS: Scheduled Meds:  enoxaparin (LOVENOX) injection  40 mg Subcutaneous Q24H   folic acid  1 mg Oral Daily   metoprolol tartrate  12.5 mg Oral Q8H   multivitamin with minerals  1 tablet Oral Daily   sodium chloride flush  3 mL Intravenous Q12H   thiamine (VITAMIN B1) injection  100 mg Intravenous Q24H   Continuous Infusions:  dextrose 5 % and 0.45 % NaCl 75 mL/hr at 10/08/22 0801   potassium chloride 10 mEq (10/08/22 0901)   PRN Meds:.acetaminophen **OR** acetaminophen, LORazepam **OR** LORazepam, ondansetron **OR** ondansetron (ZOFRAN) IV, mouth rinse, senna-docusate   I have personally reviewed following labs and imaging studies  LABORATORY DATA: CBC: Recent Labs  Lab 10/07/22 1423 10/08/22 0043  WBC 10.0 8.3  NEUTROABS 7.8*  --   HGB 14.8 13.7  HCT 45.5 42.3  MCV 95.8 96.8  PLT 201 159    Basic Metabolic Panel: Recent Labs  Lab 10/07/22 1423 10/08/22 0043 10/08/22 0538  NA 147* 151* 148*  K 3.9 2.9* 2.8*  CL 100 104 108  CO2 24 27 25   GLUCOSE 147* 124* 118*  BUN 63* 59* 58*  CREATININE 1.84* 1.52* 1.48*  CALCIUM 10.1 9.4 8.9  MG 2.6*  --  2.3    GFR: Estimated Creatinine Clearance: 46.6 mL/min (A) (by C-G formula based on SCr of 1.48 mg/dL (H)).  Liver Function Tests: Recent Labs  Lab 10/07/22 1423 10/08/22 0043  AST 52* 63*  ALT 17 16  ALKPHOS 63 53  BILITOT 1.2 1.0  PROT 7.4 6.3*  ALBUMIN 3.6 2.9*   No results for input(s): "LIPASE", "AMYLASE" in the last 168 hours. No results for input(s): "AMMONIA" in the last 168 hours.  Coagulation Profile: No results for input(s): "INR", "PROTIME" in the last 168 hours.  Cardiac Enzymes: Recent Labs  Lab 10/07/22 1423 10/08/22 0043  CKTOTAL 2,363* 3,188*    BNP (last 3  results) No results for input(s): "PROBNP" in the last 8760 hours.  Lipid Profile: No results for input(s): "CHOL", "HDL", "LDLCALC", "TRIG", "CHOLHDL", "LDLDIRECT" in the last 72 hours.  Thyroid Function Tests: Recent Labs    10/08/22 0043  TSH 0.840    Anemia Panel: Recent Labs    10/08/22 0043  VITAMINB12 187    Urine analysis: No results found for: "COLORURINE", "APPEARANCEUR", "LABSPEC", "PHURINE", "GLUCOSEU", "HGBUR", "BILIRUBINUR", "KETONESUR", "PROTEINUR", "UROBILINOGEN", "NITRITE", "LEUKOCYTESUR"  Sepsis Labs: Lactic Acid, Venous    Component Value Date/Time   LATICACIDVEN 1.6 10/07/2022 2000    MICROBIOLOGY: No results found for this or any previous visit (from the past 240 hour(s)).  RADIOLOGY STUDIES/RESULTS: CT Head Wo Contrast  Result Date: 10/07/2022 CLINICAL DATA:  Falls, failure to thrive. EXAM: CT HEAD WITHOUT CONTRAST TECHNIQUE: Contiguous axial images were obtained from the base of the skull through the vertex without intravenous contrast. RADIATION DOSE REDUCTION: This exam was performed according  to the departmental dose-optimization program which includes automated exposure control, adjustment of the mA and/or kV according to patient size and/or use of iterative reconstruction technique. COMPARISON:  None Available. FINDINGS: Brain: No intracranial hemorrhage, mass effect, or midline shift. Moderate generalized atrophy. No hydrocephalus. The basilar cisterns are patent. Moderate to advanced chronic small vessel ischemia. Small remote lacunar infarcts in the basal ganglia. No evidence of territorial infarct or acute ischemia. No extra-axial or intracranial fluid collection. Vascular: Atherosclerosis of skullbase vasculature without hyperdense vessel or abnormal calcification. Skull: No fracture or focal lesion. Sinuses/Orbits: No acute findings. Tiny mucous retention cysts in the maxillary sinuses. No mastoid effusion. Other: None. IMPRESSION: 1. No acute  intracranial abnormality. 2. Moderate atrophy and chronic small vessel ischemia. Electronically Signed   By: Narda Rutherford M.D.   On: 10/07/2022 16:45   CT Cervical Spine Wo Contrast  Result Date: 10/07/2022 CLINICAL DATA:  Falls and failure to thrive EXAM: CT CERVICAL SPINE WITHOUT CONTRAST TECHNIQUE: Multidetector CT imaging of the cervical spine was performed without intravenous contrast. Multiplanar CT image reconstructions were also generated. RADIATION DOSE REDUCTION: This exam was performed according to the departmental dose-optimization program which includes automated exposure control, adjustment of the mA and/or kV according to patient size and/or use of iterative reconstruction technique. COMPARISON:  None Available. FINDINGS: Alignment: Straightening of normal lordosis. No traumatic subluxation. Skull base and vertebrae: No acute fracture. Vertebral body heights are maintained. The dens and skull base are intact. Soft tissues and spinal canal: No prevertebral fluid or swelling. No visible canal hematoma. Disc levels: Moderate diffuse degenerative disc disease with large anterior spurs. Mild to moderate multilevel facet hypertrophy. No high-grade canal stenosis. Upper chest: No acute findings. Other: None. IMPRESSION: Moderate degenerative change throughout the cervical spine without acute fracture or subluxation. Electronically Signed   By: Narda Rutherford M.D.   On: 10/07/2022 16:43   DG Pelvis Portable  Result Date: 10/07/2022 CLINICAL DATA:  Fall EXAM: PORTABLE PELVIS 1 VIEWS COMPARISON:  None Available. FINDINGS: Limited evaluation of the right hip due to patient rotation, additionally portions of the proximal right femur and right pelvis are excluded from the field of view. No evidence of left hip fracture. Degenerative changes of the partially visualized lumbar spine. Mild degenerative changes of the left hip. Vascular calcifications. Soft tissues are unremarkable. IMPRESSION: 1.  Limited evaluation of the right hip due to patient rotation, additionally portions of the proximal right femur and right pelvis are excluded from the field of view. Consider repeat imaging. 2. No evidence of left hip fracture. Electronically Signed   By: Allegra Lai M.D.   On: 10/07/2022 15:20   DG Chest Portable 1 View  Result Date: 10/07/2022 CLINICAL DATA:  Fall EXAM: PORTABLE CHEST 1 VIEW COMPARISON:  Chest x-ray dated August 26, 2013 FINDINGS: Patient rotation somewhat limits evaluation. Cardiac and mediastinal contours within normal limits. Lungs are clear. No evidence of pleural effusion or pneumothorax. No displaced fracture. IMPRESSION: No acute cardiopulmonary process. Electronically Signed   By: Allegra Lai M.D.   On: 10/07/2022 15:17     LOS: 1 day   Jeoffrey Massed, MD  [facility]    To contact the attending provider between 7A-7P or the covering provider during after hours 7P-7A, please log into the web site [email or web address] and access using universal [place] password for that web site. If you do not have the password, please call the hospital operator.  10/08/2022, 9:52 AM

## 2022-10-08 NOTE — Progress Notes (Signed)
Patient's pulse in 170's to 180's notified by [facility] in [facility] stated rhythm is a-fib with RVR. On call MD paged and Charge nurse notified. Pt has no complaints, pt denies any pain, sob, chest pressure, or weakness.

## 2022-10-08 NOTE — Progress Notes (Signed)
Initial Nutrition Assessment  DOCUMENTATION CODES:  Not applicable  INTERVENTION:  Liberalize pt diet to finger foods Automatic house trays Ensure Enlive po TID, each supplement provides 350 kcal and 20 grams of protein. Continue Thiamine, folic acid, and Multivitamin w/ minerals daily  NUTRITION DIAGNOSIS:  Inadequate oral intake related to  (refusal to eat) as evidenced by  (per sitter/RN).  GOAL:  Patient will meet greater than or equal to 90% of their needs  MONITOR:  PO intake, Supplement acceptance, I & O's, Labs, Weight trends  REASON FOR ASSESSMENT:  Consult Assessment of nutrition requirement/status  ASSESSMENT:  72 y.o. male presented to the ED by EMS under IVC due to multiple falls at home, inability to care for self, and refusal of care from family. PMH includes HTN, HLD, CAD s/p STEMI, and EtOH abuse. Pt admitted with rhabdomyolysis and AKI vs CKD.   Pt laying in bed, safety sitter at bedside. Pt answers no or ignores any of RD's questions. Per sitter, pt has refused any offerings of food or drinks. RD to have house tray's sent up for pt. Discussed with RN as well. Ordering Ensure and just to offer to pt. Unable to assess for weight loss due to no weights in EMR from the past year.  Discussed with MD in rounds. MD to start Vitamin B12 supplementation due to low-normal value.   Medications reviewed and include: Vitamin B12, Folic acid, MVI, D5, IV potassium chloride, IV thiamine  Labs reviewed: Sodium 148, Potassium 2.8, BUN 58, Creatinine 1.48, Magnesium 2.3, Vitamin B12 187   NUTRITION - FOCUSED PHYSICAL EXAM:  Deferred to follow-up as pt refused.   Diet Order:   Diet Order             DIET FINGER FOODS Room service appropriate? No; Fluid consistency: Thin  Diet effective now                  EDUCATION NEEDS: Not appropriate for education at this time  Skin:  Skin Assessment: Reviewed RN Assessment  Last BM:  Unknown/PTA  Height:  Ht Readings  from Last 1 Encounters:  10/07/22 5\' 10"  (1.778 m)   Weight:  Wt Readings from Last 1 Encounters:  10/08/22 74.1 kg   Ideal Body Weight:  75.5 kg  BMI:  Body mass index is 23.44 kg/m.  Estimated Nutritional Needs:  Kcal:  1850-2050 Protein:  90-110 grams Fluid:  >/= 1.8 L   Kirby Crigler RD, LDN Clinical Dietitian See [facility] for contact information.

## 2022-10-08 NOTE — Consult Note (Signed)
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 male admitted medically on 10/07/2022  1:56 PM under IVC for failure to thrive and multiple falls at home. He carries no known psychiatric diagnosis and has a past medical history of  alcohol abuse, CAD-s/p PCI 2010, HTN, HLD .Psychiatry was consulted for "IVC'd by family-self neglect-?some sort of underlying psych illness at baseline-argumentative, agitated, violent at times" by Dr. Jeoffrey Massed on 10/08/22.    His current presentation of irritability and agitation is most consistent with neurocognitive disorder and prolonged grief. Current not on any psychotropic medications. He was not compliant with medications prior to admission as evidenced by wife. On initial examination, patient is irritable and mildly confused per sitter (thinks he is at home). We will re-evaluate tomorrow. He does have multiple other comorbidities that could be limiting his ability to function leading to worsening mood. Based on wife's timeline of patient's symptoms, many of his cognitive problems are likely chronic rather than acute.    Please see plan below for detailed recommendations.   Diagnoses:  Active Hospital problems: Principal Problem:   Non-traumatic rhabdomyolysis Active Problems:   Hyperlipidemia   Essential hypertension   CAD (coronary artery disease)   Alcohol abuse   Renal insufficiency   Involuntary commitment   Failure to thrive in adult     Plan   ## Psychiatric Medication Recommendations:  -- Consider mirtazapine if patient is amenable to taking PO  ## Medical Decision Making Capacity:  Not formally assessed  ## Further Work-up:  -- UA/UDS  -- most recent EKG on 10/08/22 had QtC of 436 -- Pertinent labwork reviewed earlier this admission  includes: Na 148, K 2.8, Cr 1.48, B12 187, B1 pending, TSH 0.85, CK 3188  ## Disposition:  -- There are no current psychiatric contraindications to discharge at this time  ## Behavioral / Environmental:  --  Delirium Precautions: Delirium Interventions for Nursing and Staff: - RN to open blinds every AM. - To Bedside: Glasses, hearing aide, and pt's own shoes. Make available to patients. when possible and encourage use. - Encourage po fluids when appropriate, keep fluids within reach. - OOB to chair with meals. - Passive ROM exercises to all extremities with AM & PM care. - RN to assess orientation to person, time and place QAM and PRN. - Recommend extended visitation hours with familiar family/friends as feasible. - Staff to minimize disturbances at night. Turn off television when pt asleep or when not in use.     ## Safety and Observation Level:  - Based on my clinical evaluation, I estimate the patient to be at moderate risk of self harm in the current setting - Continue safety sitter for fall risk and delirium   Suicide risk assessment  Patient has following modifiable risk factors for suicide: medication noncompliance, which we are addressing by collaborating with wife.   Patient has following non-modifiable or demographic risk factors for suicide: male gender  Patient has the following protective factors against suicide: Supportive family, Supportive friends, and no history of suicide attempts   Thank you for this consult request. Recommendations have been communicated to the primary team.  We will continue to follow at this time.   Park Pope, MD  Psychiatric and Social History   Relevant Aspects of Hospital Course:  Admitted  on 10/07/2022 for failure to thrive. Went into afib with RVR but now normal rate. Also found to have EF 20-25% on echo.    Patient Report:  Patient seen and assessed at bedside in 2 point restraint. Sitter at bedside. Upon walking in, he rolled his  eyes at me. Patient made minimal/no eye contact with me. He said 1 word sentences and intentionally turned his head away from me seeming to indicate he did not want to speak with me. Denies SI/HI/AVH. He states he had stopped drinking 6-7 months ago. Unwilling to participate further in assessment.   Per sitter, patient has not been acutely agitated but very irritable. Patient had some confusion as evidenced as him thinking he was at home.  Psych ROS:  Depression: unknown Anxiety:  unknown Mania (lifetime and current): unknown Psychosis: (lifetime and current): denies  Collateral information:  Contacted Jaroslaw Hrabe at [phone] on 10/08/22 Patient has stopped caring for himself for several months. He has occasionally stopped eating for a week for ~2 years. Acutely, he has stopped eating for the past 3 weeks. She has found him hiding his food in the trash or refusing to eat. He will sit on the couch and not move from there. He has occasionally complained of leg numbness and has fallen multiple times but continued to refuse help despite wife asking for him to seek medical help. He normally "curses out" healthcare providers but his current emotional lability has encompassed cursing out his wife and best friends which is very uncharacteristic of him. Patient had abruptly stopped drinking alcohol a month ago. She states he drank at least a half a gallon of vodka per week prior to this. He had also recently become urine incontinent which is new for him. He has had poor memory for some time now where patient will ask from something of wife and when wife comes back moment later, patient will have forgotten he asked for it.  Patient has had multiple losses where in past decade including daughter, son-in-law, and brother. Wife notes patient will occasionally stare at photos of deceased family members and repeatedly state he has "lost everyone". Wife is very concerned that patient is refusing to care for  self and refusing for anyone to help him.  Psychiatric History:  Information collected from chart review  Prev Dx/Sx: alcohol abuse Current Psych Provider: none Home Meds (current): none Previous Med Trials: unknown Therapy: none  Prior ECT: unknown Prior Psych Hospitalization: unknown  Prior Self Harm: unknown Prior Violence: unknown  Family Psych History: unknown Family Hx suicide: unknown  Social History:  Legal Hx: unknown Living Situation: lives with wife Spiritual Hx: unknown Access to weapons: will need to ask wife  Substance History Tobacco use: unknown Alcohol use: 1/2 gallon of vodka per week until a month ago Drug use: unknown   Exam Findings   Psychiatric Specialty Exam:  Presentation  General Appearance: Disheveled  Eye Contact:Fair  Speech:Clear and Coherent; Normal Rate  Speech Volume:Normal   Mood and Affect  Mood:Euthymic  Affect:Blunt; Constricted   Thought Process  Thought Processes:Coherent  Descriptions of Associations:Intact  Orientation:Other (comment) (unable to assess)  Thought Content:Scattered  Hallucinations:Hallucinations: None  Ideas of Reference:None  Suicidal Thoughts:Suicidal Thoughts: No  Homicidal Thoughts:Homicidal Thoughts: No   Sensorium  Memory:Other (comment)  Judgment:Impaired  Insight:Fair   Executive Functions  Concentration:Fair  Attention Span:Fair  Recall:Fair  Fund of Knowledge:Fair  Language:Fair   Psychomotor Activity  Psychomotor Activity:Psychomotor Activity: Decreased   Assets  Assets:Housing; Intimacy  Sleep  Sleep:Sleep: Fair    Physical Exam: Vital signs:  Temp:  [96 F (35.6 C)-98.8 F (37.1 C)] 97.6 F (36.4 C) (06/11 1100) Pulse Rate:  [54-151] 97 (06/11 1600) Resp:  [10-23] 16 (06/11 1600) BP: (58-163)/(34-115) 119/90 (06/11 1600) SpO2:  [64 %-100 %] 95 % (06/11 1600) Weight:  [74.1 kg] 74.1 kg (06/11 0000) Physical Exam  Blood pressure (!)  119/90, pulse 97, temperature 97.6 F (36.4 C), temperature source Axillary, resp. rate 16, height 5\' 10"  (1.778 m), weight 74.1 kg, SpO2 95 %. Body mass index is 23.44 kg/m.   Other History   These have been pulled in through the EMR, reviewed, and updated if appropriate.   Family History:  Unknown family psychiatric history The patient's family history includes CVA (age of onset: 65) in his mother; Cancer in his sister; Cancer (age of onset: 35) in his father; Coronary artery disease in an other family member.  Medical History: Past Medical History:  Diagnosis Date  . Alcohol abuse   . CAD (coronary artery disease)    a. inf STEMI 01/2009 => LHC - dRCA occl (tx with Xience 2.8x18 mm DES), pLAD 30, PL1 70, PL2 80, EF 55%  . Hyperlipidemia   . Hypertension   . ST elevation myocardial infarction (STEMI) of true posterior wall [facility])     Surgical History: Had a stent placed due to STEMI in 2010  Medications:   Current Facility-Administered Medications:  .  acetaminophen (TYLENOL) tablet 650 mg, 650 mg, Oral, Q6H PRN **OR** acetaminophen (TYLENOL) suppository 650 mg, 650 mg, Rectal, Q6H PRN, Allena Katz, Vishal R, MD .  cyanocobalamin (VITAMIN B12) injection 1,000 mcg, 1,000 mcg, Subcutaneous, Q2000, Ghimire, Shanker M, MD .  dextrose 5 % and 0.45 % NaCl infusion, , Intravenous, Continuous, Ghimire, Werner Lean, MD, Last Rate: 75 mL/hr at 10/08/22 1506, Infusion Verify at 10/08/22 1506 .  enoxaparin (LOVENOX) injection 40 mg, 40 mg, Subcutaneous, Q24H, Darreld Mclean R, MD, 40 mg at 10/07/22 2001 .  feeding supplement (ENSURE ENLIVE / ENSURE PLUS) liquid 237 mL, 237 mL, Oral, TID BM, Ghimire, Werner Lean, MD .  folic acid (FOLVITE) tablet 1 mg, 1 mg, Oral, Daily, Elpidio Anis, Benetta Spar C, MD .  LORazepam (ATIVAN) tablet 1-4 mg, 1-4 mg, Oral, Q1H PRN **OR** LORazepam (ATIVAN) injection 1-4 mg, 1-4 mg, Intravenous, Q1H PRN, Mardene Sayer, MD, 1 mg at 10/07/22 1556 .  metoprolol tartrate  (LOPRESSOR) injection 2.5 mg, 2.5 mg, Intravenous, Q8H, Ghimire, Shanker M, MD, 2.5 mg at 10/08/22 1205 .  multivitamin with minerals tablet 1 tablet, 1 tablet, Oral, Daily, Elpidio Anis, Benetta Spar C, MD .  ondansetron (ZOFRAN) tablet 4 mg, 4 mg, Oral, Q6H PRN **OR** ondansetron (ZOFRAN) injection 4 mg, 4 mg, Intravenous, Q6H PRN, Charlsie Quest, MD .  Oral care mouth rinse, 15 mL, Mouth Rinse, PRN, Ghimire, Werner Lean, MD .  senna-docusate (Senokot-S) tablet 1 tablet, 1 tablet, Oral, QHS PRN, Allena Katz, Vishal R, MD .  sodium chloride flush (NS) 0.9 % injection 3 mL, 3 mL, Intravenous, Q12H, Darreld Mclean R, MD, 3 mL at 10/08/22 0813 .  thiamine (VITAMIN B1) 500 mg in sodium chloride 0.9 % 50 mL IVPB, 500 mg, Intravenous, Q12H, Ghimire, Werner Lean, MD, Stopped at 10/08/22 1132  Allergies: No Known Allergies

## 2022-10-08 NOTE — Progress Notes (Signed)
Overnight progress note  Notified by RN that patient is tachycardic again with heart rate going up to as high as 190s and CCMG reporting A-fib with RVR.  Patient now hypotensive with systolic in the low 80s.    Patient again seen at bedside.  Awake and alert, angry and using foul language.  Not answering any questions.   EKG done:   Potassium low on repeat labs and being replaced, repeat magnesium level.  TSH normal.  Patient appears very dehydrated.  RN reporting urine output of approximately 200 mL since the beginning of the shift and bladder scan showing only 76 mL.  Sodium also trending up on repeat labs.  Discussed with cardiology (Dr. Welton Flakes), recommending continuing IV fluid resuscitation and if blood pressure improves then trying low-dose IV metoprolol.  He has signed out the patient to incoming dayshift cardiology team and they will consult.

## 2022-10-08 NOTE — Consult Note (Signed)
Cardiology Consultation   Patient ID: Gabriel Becker MRN: [id number]; DOB: 06-Mar-1951  Admit date: 10/07/2022 Date of Consult: 10/08/2022    Patient Profile:   Gabriel Becker is a 72 y.o. male with a hx of CAD, HTN, EtoH abuse who is being seen 10/08/2022 for the evaluation of afib with RVR at the request of Dr Jerral Ralph.  History of Present Illness:   Gabriel Becker is a 72 yo with hx of CAD (s/p IWMI in 2010, s/p PTCA/DEs to RCA), HTN, HL    Last seen in cardiology clinic in 2015.    The pt was admitted yesterday(10/07/22) with failure to thrive, frequent falls at home . Pt unable to care for himself, not eating.  Has been drinking EtoH Initial CK 2363 Cr 1.84    Pt started on IV fluids     Trop 168, 171.  Pt denied CP   ON admit was in ST initially   HR increased to the 200s  (ST vs SVT)   Pt received IV fluids (2.85 L positive) and IV lopressor   Pt reverted to SR.   Continued of IV b blocker (refusing PO meds)  Currently the pt appears comfortable in bed  He denies CP   Refuses physical exam.    Past Medical History:  Diagnosis Date   Alcohol abuse    CAD (coronary artery disease)    a. inf STEMI 01/2009 => LHC - dRCA occl (tx with Xience 2.8x18 mm DES), pLAD 30, PL1 70, PL2 80, EF 55%   Hyperlipidemia    Hypertension    ST elevation myocardial infarction (STEMI) of true posterior wall (HCC)      Home Medications:  Prior to Admission medications   Medication Sig Start Date End Date Taking? Authorizing Provider  amLODipine (NORVASC) 10 MG tablet TAKE 1 TABLET (10 MG TOTAL) BY MOUTH DAILY. Patient not taking: Reported on 10/08/2022 04/14/15   Laurey Morale, MD  aspirin 81 MG tablet Take 1 tablet (81 mg total) by mouth daily. Patient not taking: Reported on 10/08/2022 02/15/11   Kathleene Hazel, MD  atorvastatin (LIPITOR) 80 MG tablet TAKE 1 TABLET (80 MG TOTAL) BY MOUTH DAILY. Patient not taking: Reported on 10/08/2022 01/25/15   Kathleene Hazel, MD  hydrALAZINE  (APRESOLINE) 25 MG tablet Take 1 tablet (25 mg total) by mouth 3 (three) times daily. Patient not taking: Reported on 10/08/2022 09/30/13   Tereso Newcomer T, PA-C  losartan-hydrochlorothiazide (HYZAAR) 100-25 MG per tablet Take 1 tablet by mouth daily. Patient not taking: Reported on 10/08/2022 09/13/14   Shelva Majestic, MD  metoprolol succinate (TOPROL-XL) 50 MG 24 hr tablet Take 1 tablet (50 mg total) by mouth daily. Take with or immediately following a meal. Patient not taking: Reported on 10/08/2022 09/30/14   Shelva Majestic, MD  nitroGLYCERIN (NITROSTAT) 0.4 MG SL tablet Place 0.4 mg under the tongue every 5 (five) minutes as needed. Take 1 tablet under tongue at onset of chest pain; you may repeat every 5 minutes for up to 3 doses.  Patient not taking: Reported on 10/08/2022    [provider]    Inpatient Medications: Scheduled Meds:  cyanocobalamin  1,000 mcg Subcutaneous Q2000   enoxaparin (LOVENOX) injection  40 mg Subcutaneous Q24H   feeding supplement  237 mL Oral TID BM   folic acid  1 mg Oral Daily   metoprolol tartrate  2.5 mg Intravenous Q8H   multivitamin with minerals  1 tablet Oral Daily  sodium chloride flush  3 mL Intravenous Q12H   Continuous Infusions:  dextrose 5 % and 0.45 % NaCl 75 mL/hr at 10/08/22 1757   thiamine (VITAMIN B1) injection Stopped (10/08/22 1132)   Allergies:   No Known Allergies  Social History:   Social History   Socioeconomic History   Marital status: Married    Spouse name: Not on file   Number of children: Not on file   Years of education: Not on file   Highest education level: Not on file  Occupational History   Occupation: Drives delivery truck  Tobacco Use   Smoking status: Former    Packs/day: 1.00    Years: 42.00    Additional pack years: 0.00    Total pack years: 42.00    Types: Cigarettes    Quit date: 08/27/2008    Years since quitting: 14.1   Smokeless tobacco: Not on file  Substance and Sexual Activity    Alcohol use: Yes    Alcohol/week: 1.0 standard drink of alcohol    Types: 1 Shots of liquor per week   Drug use: No    Comment: denies   Sexual activity: Not on file  Other Topics Concern   Not on file  Social History Narrative    He lives in [place] with his wife.   Married 1977. 1 living daughter (lost one at age 25 to PNA) 2 grandkids. Daughter/grandkids from former relationship.       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        Highest level of education 10th grade   Social Determinants of Health   Financial Resource Strain: Not on file  Food Insecurity: Not on file  Transportation Needs: Not on file  Physical Activity: Not on file  Stress: Not on file  Social Connections: Not on file  Intimate Partner Violence: Not on file    Family History:    Family History  Problem Relation Age of Onset   Cancer Father 30       unknown cancer   Coronary artery disease Other        siblings   Cancer Sister        x2, unknown   CVA Mother 67     ROS:  Please see the history of present illness.   All other ROS reviewed and negative.     Physical Exam/Data:   Vitals:   10/08/22 1600 10/08/22 1700 10/08/22 1703 10/08/22 1800  BP: (!) 119/90 100/80  (!) 119/93  Pulse: 97 100 93 100  Resp: 16 18 16 18   Temp: 97.6 F (36.4 C)     TempSrc: Oral     SpO2: 95% 95% 94% 97%  Weight:      Height:        Intake/Output Summary (Last 24 hours) at 10/08/2022 1829 Last data filed at 10/08/2022 1643 Gross per 24 hour  Intake 2179.03 ml  Output 475 ml  Net 1704.03 ml      10/08/2022   12:00 AM 10/07/2022    2:16 PM 10/14/2014    3:49 PM  Last 3 Weights  Weight (lbs) 163 lb 5.8 oz 162 lb 0.6 oz 166 lb  Weight (kg) 74.1 kg 73.5 kg 75.297 kg     Body mass index is 23.44 kg/m.  General:  Well nourished, well developed, in no acute distress Pt refused physical exam   EKG:  The EKG was personally reviewed and demonstrates:   1  10/07/22  STwith PACs  122 bpm  Anteroseptal MI    Nonspecific ST changes  2 Telemetry:  Telemetry was personally reviewed and demonstrates:    Relevant CV Studies:  Echo  10/08/22   1. Left ventricular ejection fraction, by estimation, is 20 to 25%. The  left ventricle has severely decreased function. Left ventricular  endocardial border not optimally defined to evaluate regional wall motion.  Left ventricular diastolic function could   not be evaluated.   2. Right ventricular systolic function is normal. The right ventricular  size is normal. There is normal pulmonary artery systolic pressure. The  estimated right ventricular systolic pressure is 24.5 mmHg.   3. The mitral valve was not well visualized. No evidence of mitral valve  regurgitation.   4. The aortic valve was not well visualized. Aortic valve regurgitation  is mild. No aortic stenosis is present.   5. The inferior vena cava is normal in size with greater than 50%  respiratory variability, suggesting right atrial pressure of 3 mmHg.   LHC  OCt 2010          dRCA occl (tx with Xience 2.8x18 mm DES), pLAD 30, PL1 70, PL2 80, EF 55%     Laboratory Data:  High Sensitivity Troponin:   Recent Labs  Lab 10/07/22 1423 10/07/22 1603  TROPONINIHS 168* 171*     Chemistry Recent Labs  Lab 10/07/22 1423 10/08/22 0043 10/08/22 0538  NA 147* 151* 148*  K 3.9 2.9* 2.8*  CL 100 104 108  CO2 24 27 25   GLUCOSE 147* 124* 118*  BUN 63* 59* 58*  CREATININE 1.84* 1.52* 1.48*  CALCIUM 10.1 9.4 8.9  MG 2.6*  --  2.3  GFRNONAA 38* 48* 50*  ANIONGAP 23* 20* 15    Recent Labs  Lab 10/07/22 1423 10/08/22 0043  PROT 7.4 6.3*  ALBUMIN 3.6 2.9*  AST 52* 63*  ALT 17 16  ALKPHOS 63 53  BILITOT 1.2 1.0   Lipids No results for input(s): "CHOL", "TRIG", "HDL", "LABVLDL", "LDLCALC", "CHOLHDL" in the last 168 hours.  Hematology Recent Labs  Lab 10/07/22 1423 10/08/22 0043  WBC 10.0 8.3  RBC 4.75 4.37  HGB 14.8 13.7  HCT 45.5 42.3  MCV 95.8 96.8  MCH 31.2 31.4  MCHC  32.5 32.4  RDW 14.0 13.9  PLT 201 159   Thyroid  Recent Labs  Lab 10/08/22 0043  TSH 0.840    BNPNo results for input(s): "BNP", "PROBNP" in the last 168 hours.  DDimer No results for input(s): "DDIMER" in the last 168 hours.   Radiology/Studies:  ECHOCARDIOGRAM COMPLETE  Result Date: 10/08/2022    ECHOCARDIOGRAM REPORT   Patient Name:   DONTRAVIOUS DONIA Date of Exam: 10/08/2022 Medical Rec #:  [id number]     Height:       70.0 in Accession #:    [id number]    Weight:       163.4 lb Date of Birth:  Jun 13, 1950     BSA:          1.915 m Patient Age:    71 years      BP:           96/83 mmHg Patient Gender: M             HR:           108 bpm. Exam Location:  Inpatient Procedure: 2D Echo, Cardiac Doppler and Color Doppler Indications:    Atrial Fibrillation  History:  Patient has no prior history of Echocardiogram examinations. CAD                 and Previous Myocardial Infarction, Arrythmias:Tachycardia; Risk                 Factors:Dyslipidemia, Hypertension, Former Smoker and alcohol                 abuse.  Sonographer:    Wallie Char Referring Phys: [phone] [facility]  Sonographer Comments: Technically challenging study due to limited acoustic windows. Image acquisition challenging due to uncooperative patient. IMPRESSIONS  1. Left ventricular ejection fraction, by estimation, is 20 to 25%. The left ventricle has severely decreased function. Left ventricular endocardial border not optimally defined to evaluate regional wall motion. Left ventricular diastolic function could  not be evaluated.  2. Right ventricular systolic function is normal. The right ventricular size is normal. There is normal pulmonary artery systolic pressure. The estimated right ventricular systolic pressure is 24.5 mmHg.  3. The mitral valve was not well visualized. No evidence of mitral valve regurgitation.  4. The aortic valve was not well visualized. Aortic valve regurgitation is mild. No aortic stenosis is  present.  5. The inferior vena cava is normal in size with greater than 50% respiratory variability, suggesting right atrial pressure of 3 mmHg. FINDINGS  Left Ventricle: Left ventricular ejection fraction, by estimation, is 20 to 25%. The left ventricle has severely decreased function. Left ventricular endocardial border not optimally defined to evaluate regional wall motion. The left ventricular internal cavity size was normal in size. There is no left ventricular hypertrophy. Left ventricular diastolic function could not be evaluated due to atrial fibrillation. Left ventricular diastolic function could not be evaluated. Right Ventricle: The right ventricular size is normal. No increase in right ventricular wall thickness. Right ventricular systolic function is normal. There is normal pulmonary artery systolic pressure. The tricuspid regurgitant velocity is 2.32 m/s, and  with an assumed right atrial pressure of 3 mmHg, the estimated right ventricular systolic pressure is 24.5 mmHg. Left Atrium: Left atrial size was not well visualized. Right Atrium: Right atrial size was not well visualized. Pericardium: Trivial pericardial effusion is present. Mitral Valve: The mitral valve was not well visualized. No evidence of mitral valve regurgitation. MV peak gradient, 3.8 mmHg. The mean mitral valve gradient is 1.5 mmHg. Tricuspid Valve: The tricuspid valve is grossly normal. Tricuspid valve regurgitation is trivial. No evidence of tricuspid stenosis. Aortic Valve: The aortic valve was not well visualized. Aortic valve regurgitation is mild. Aortic regurgitation PHT measures 573 msec. No aortic stenosis is present. Aortic valve mean gradient measures 2.0 mmHg. Aortic valve peak gradient measures 4.2 mmHg. Aortic valve area, by VTI measures 2.17 cm. Pulmonic Valve: The pulmonic valve was not well visualized. Aorta: The aortic root is normal in size and structure. Venous: The inferior vena cava is normal in size with  greater than 50% respiratory variability, suggesting right atrial pressure of 3 mmHg. IAS/Shunts: The interatrial septum was not well visualized.  LEFT VENTRICLE PLAX 2D LVOT diam:     1.80 cm     Diastology LV SV:         27          LV e' medial:    5.58 cm/s LV SV Index:   14          LV E/e' medial:  13.6 LVOT Area:     2.54 cm    LV e'  lateral:   5.58 cm/s                            LV E/e' lateral: 13.6  LV Volumes (MOD) LV vol d, MOD A2C: 53.1 ml LV vol d, MOD A4C: 56.2 ml LV vol s, MOD A2C: 42.0 ml LV vol s, MOD A4C: 45.7 ml LV SV MOD A2C:     11.1 ml LV SV MOD A4C:     56.2 ml LV SV MOD BP:      11.3 ml RIGHT VENTRICLE RV Basal diam:  2.80 cm RV S prime:     32.50 cm/s TAPSE (M-mode): 1.5 cm LEFT ATRIUM             Index       RIGHT ATRIUM          Index LA Vol (A2C):   12.7 ml 6.63 ml/m  RA Area:     9.33 cm LA Vol (A4C):   11.8 ml 6.16 ml/m  RA Volume:   18.90 ml 9.87 ml/m LA Biplane Vol: 12.9 ml 6.74 ml/m  AORTIC VALVE AV Area (Vmax):    1.84 cm AV Area (Vmean):   1.85 cm AV Area (VTI):     2.17 cm AV Vmax:           102.45 cm/s AV Vmean:          70.850 cm/s AV VTI:            0.126 m AV Peak Grad:      4.2 mmHg AV Mean Grad:      2.0 mmHg LVOT Vmax:         74.10 cm/s LVOT Vmean:        51.600 cm/s LVOT VTI:          0.108 m LVOT/AV VTI ratio: 0.85 AI PHT:            573 msec  AORTA Ao Root diam: 3.50 cm MITRAL VALVE               TRICUSPID VALVE MV Area (PHT): 6.63 cm    TR Peak grad:   21.5 mmHg MV Area VTI:   2.18 cm    TR Vmax:        232.00 cm/s MV Peak grad:  3.8 mmHg MV Mean grad:  1.5 mmHg    SHUNTS MV Vmax:       0.98 m/s    Systemic VTI:  0.11 m MV Vmean:      50.1 cm/s   Systemic Diam: 1.80 cm MV Decel Time: 115 msec MV E velocity: 75.65 cm/s MV A velocity: 89.40 cm/s MV E/A ratio:  0.85 Lennie Odor MD Electronically signed by Lennie Odor MD Signature Date/Time: 10/08/2022/10:26:39 AM    Final    CT Head Wo Contrast  Result Date: 10/07/2022 CLINICAL DATA:  Falls, failure to  thrive. EXAM: CT HEAD WITHOUT CONTRAST TECHNIQUE: Contiguous axial images were obtained from the base of the skull through the vertex without intravenous contrast. RADIATION DOSE REDUCTION: This exam was performed according to the departmental dose-optimization program which includes automated exposure control, adjustment of the mA and/or kV according to patient size and/or use of iterative reconstruction technique. COMPARISON:  None Available. FINDINGS: Brain: No intracranial hemorrhage, mass effect, or midline shift. Moderate generalized atrophy. No hydrocephalus. The basilar cisterns are patent. Moderate to advanced chronic small vessel ischemia. Small remote lacunar infarcts in the  basal ganglia. No evidence of territorial infarct or acute ischemia. No extra-axial or intracranial fluid collection. Vascular: Atherosclerosis of skullbase vasculature without hyperdense vessel or abnormal calcification. Skull: No fracture or focal lesion. Sinuses/Orbits: No acute findings. Tiny mucous retention cysts in the maxillary sinuses. No mastoid effusion. Other: None. IMPRESSION: 1. No acute intracranial abnormality. 2. Moderate atrophy and chronic small vessel ischemia. Electronically Signed   By: Narda Rutherford M.D.   On: 10/07/2022 16:45   CT Cervical Spine Wo Contrast  Result Date: 10/07/2022 CLINICAL DATA:  Falls and failure to thrive EXAM: CT CERVICAL SPINE WITHOUT CONTRAST TECHNIQUE: Multidetector CT imaging of the cervical spine was performed without intravenous contrast. Multiplanar CT image reconstructions were also generated. RADIATION DOSE REDUCTION: This exam was performed according to the departmental dose-optimization program which includes automated exposure control, adjustment of the mA and/or kV according to patient size and/or use of iterative reconstruction technique. COMPARISON:  None Available. FINDINGS: Alignment: Straightening of normal lordosis. No traumatic subluxation. Skull base and  vertebrae: No acute fracture. Vertebral body heights are maintained. The dens and skull base are intact. Soft tissues and spinal canal: No prevertebral fluid or swelling. No visible canal hematoma. Disc levels: Moderate diffuse degenerative disc disease with large anterior spurs. Mild to moderate multilevel facet hypertrophy. No high-grade canal stenosis. Upper chest: No acute findings. Other: None. IMPRESSION: Moderate degenerative change throughout the cervical spine without acute fracture or subluxation. Electronically Signed   By: Narda Rutherford M.D.   On: 10/07/2022 16:43   DG Pelvis Portable  Result Date: 10/07/2022 CLINICAL DATA:  Fall EXAM: PORTABLE PELVIS 1 VIEWS COMPARISON:  None Available. FINDINGS: Limited evaluation of the right hip due to patient rotation, additionally portions of the proximal right femur and right pelvis are excluded from the field of view. No evidence of left hip fracture. Degenerative changes of the partially visualized lumbar spine. Mild degenerative changes of the left hip. Vascular calcifications. Soft tissues are unremarkable. IMPRESSION: 1. Limited evaluation of the right hip due to patient rotation, additionally portions of the proximal right femur and right pelvis are excluded from the field of view. Consider repeat imaging. 2. No evidence of left hip fracture. Electronically Signed   By: Allegra Lai M.D.   On: 10/07/2022 15:20   DG Chest Portable 1 View  Result Date: 10/07/2022 CLINICAL DATA:  Fall EXAM: PORTABLE CHEST 1 VIEW COMPARISON:  Chest x-ray dated August 26, 2013 FINDINGS: Patient rotation somewhat limits evaluation. Cardiac and mediastinal contours within normal limits. Lungs are clear. No evidence of pleural effusion or pneumothorax. No displaced fracture. IMPRESSION: No acute cardiopulmonary process. Electronically Signed   By: Allegra Lai M.D.   On: 10/07/2022 15:17     Assessment and Plan:   Afib with RVR   PT with episodes of afib with  RVR early this morning   Sustaind for a few hours   Now back in SR  On metoprolol 2.5 mg tid IV   BP is tolerating    Keep on current regimen     WIth hx of falls, failure to thrive at home I do not think he is an anticoagulation candidate, unless conditions change  2 HFrEF LVEF severely depressed  Will assess volume when examine   Overall appears comfortable in bed BP is marginal at times    Would not advance meds  3  Hx CAD   Hx STEMI in 2010  s/p DES to RCA   Currently no symptoms of CP Minimal elevation  of troponin (168, 171)  probably reflects demand in setting of metabolic problems With drop in LV function concern for worsening CAD  Follow for now  4   Rhabdomyolysis    CK 3188    Pt receiving IV hydaration.  Follow I/O closely       For questions or updates, please contact [facility] Please consult [email or web address] for contact info under    Signed, Dietrich Pates, MD  10/08/2022 6:29 PM

## 2022-10-08 NOTE — TOC Initial Note (Signed)
Transition of Care [facility]) - Initial/Assessment Note    Patient Details  Name: Gabriel Becker MRN: [id number] Date of Birth: 11-Sep-1950  Transition of Care [facility] CM/SW Contact:    Erin Sons, LCSW Phone Number: 10/08/2022, 1:31 PM  Clinical Narrative:                  Pt admitted for nontraumatic rhabdomyolysis and AKI. Alcohol use and multiple falls at home. He was IVC'd by family for self-neglect IVC valid until 10/14/22. TOC will follow to assist with any DC needs.   Barriers to Discharge: Continued Medical Work up   Patient Goals and CMS Choice            Expected Discharge Plan and Services       Living arrangements for the past 2 months: Single Family Home                                      Prior Living Arrangements/Services Living arrangements for the past 2 months: Single Family Home Lives with:: Spouse                   Activities of Daily Living      Permission Sought/Granted                  Emotional Assessment         Alcohol / Substance Use: Alcohol Use Psych Involvement: No (comment)  Admission diagnosis:  Hypernatremia [E87.0] Uremia [N19] Elevated troponin [R79.89] Failure to thrive in adult [R62.7] Non-traumatic rhabdomyolysis [M62.82] Patient Active Problem List   Diagnosis Date Noted   Non-traumatic rhabdomyolysis 10/07/2022   Renal insufficiency 10/07/2022   Involuntary commitment 10/07/2022   Failure to thrive in adult 10/07/2022   Alcohol abuse 04/11/2014   Arthritis 04/11/2014   Former smoker 04/11/2014   Hyperlipidemia 03/07/2009   Essential hypertension 03/07/2009   CAD (coronary artery disease) 03/07/2009   PCP:  Pcp, No Pharmacy:   [facility] - [place], [place] - [address] [place] [address] Phone: [phone] Fax: [phone]  [id number] - [place], [place] - [address] [place] [address] Phone:  [phone] Fax: [phone]     Social Determinants of Health (SDOH) Social History: SDOH Screenings   Tobacco Use: Medium Risk (10/07/2022)   SDOH Interventions:     Readmission Risk Interventions     No data to display

## 2022-10-08 NOTE — Progress Notes (Signed)
Paged MD and contacted Charge nurse and rapid response. Patient in a-fib with RVR rate 190's-200's. Pt symptomatic with hypotension.

## 2022-10-08 NOTE — Progress Notes (Signed)
During shift patient refused to answer orientation questions or follow commands.  He became agitated with assessments and would use profanity.  No violent incidents.  Patient remains in bilateral soft wrist restraints.  Patient refusing to eat, drink or take pills by mouth.  Dietitian, psych and hospitalist aware.  UOP for shift .  Dr. Jerral Ralph notified.  [place] scan q8 hours ordered.  Bladder scan 97mL.

## 2022-10-09 DIAGNOSIS — I4891 Unspecified atrial fibrillation: Secondary | ICD-10-CM | POA: Diagnosis not present

## 2022-10-09 DIAGNOSIS — I5020 Unspecified systolic (congestive) heart failure: Secondary | ICD-10-CM | POA: Diagnosis not present

## 2022-10-09 DIAGNOSIS — F1010 Alcohol abuse, uncomplicated: Secondary | ICD-10-CM | POA: Diagnosis not present

## 2022-10-09 DIAGNOSIS — R627 Adult failure to thrive: Secondary | ICD-10-CM | POA: Diagnosis not present

## 2022-10-09 DIAGNOSIS — M6282 Rhabdomyolysis: Secondary | ICD-10-CM | POA: Diagnosis not present

## 2022-10-09 DIAGNOSIS — E785 Hyperlipidemia, unspecified: Secondary | ICD-10-CM | POA: Diagnosis not present

## 2022-10-09 LAB — MAGNESIUM: Magnesium: 2 mg/dL (ref 1.7–2.4)

## 2022-10-09 LAB — COMPREHENSIVE METABOLIC PANEL
ALT: 15 U/L (ref 0–44)
AST: 57 U/L — ABNORMAL HIGH (ref 15–41)
Albumin: 2.7 g/dL — ABNORMAL LOW (ref 3.5–5.0)
Alkaline Phosphatase: 50 U/L (ref 38–126)
Anion gap: 15 (ref 5–15)
BUN: 36 mg/dL — ABNORMAL HIGH (ref 8–23)
CO2: 27 mmol/L (ref 22–32)
Calcium: 9.4 mg/dL (ref 8.9–10.3)
Chloride: 102 mmol/L (ref 98–111)
Creatinine, Ser: 1.08 mg/dL (ref 0.61–1.24)
GFR, Estimated: >60 mL/min (ref >60)
Glucose, Bld: 113 mg/dL — ABNORMAL HIGH (ref 70–99)
Potassium: 3.3 mmol/L — ABNORMAL LOW (ref 3.5–5.1)
Sodium: 144 mmol/L (ref 135–145)
Total Bilirubin: 0.8 mg/dL (ref 0.3–1.2)
Total Protein: 5.9 g/dL — ABNORMAL LOW (ref 6.5–8.1)

## 2022-10-09 LAB — RPR: RPR Ser Ql: NONREACTIVE

## 2022-10-09 LAB — CK: Total CK: 1876 U/L — ABNORMAL HIGH (ref 49–397)

## 2022-10-09 MED ORDER — POTASSIUM CHLORIDE 10 MEQ/100ML IV SOLN
10.0000 meq | INTRAVENOUS | Status: AC
  Administered 2022-10-09 (×3): 10 meq via INTRAVENOUS
  Filled 2022-10-09 (×2): qty 100

## 2022-10-09 NOTE — Progress Notes (Addendum)
PROGRESS NOTE        PATIENT DETAILS Name: Gabriel Becker Age: 72 y.o. Sex: male Date of Birth: 01/10/1951 Admit Date: 10/07/2022 Admitting Physician Charlsie Quest, MD PCP:Pcp, No  Brief Summary: Patient is a 72 y.o.  male with history of CAD-s/p PCI 2010, HTN, HLD, alcohol use-who was brought to the ED by EMS/PD-under IVC by family for self-neglect/recurrent falls/refusal of care.  Apparently he was father passed several months ago-and has been on a downward spiral.  See below for further details.  Significant events: 6/10>> admit to [facility] 6/11>> PAF with RVR  Significant studies: 6/10>> CT head: No acute intracranial abnormality. 6/10>> CT C-spine: No fracture/subluxation 6/11>> TSH: Normal 6/11>> vitamin B12: 187 (low) 6/11>> vitamin B1: Pending 6/11>> echo: EF 20-25%. 6/12>> RPR: Nonreactive.  Significant microbiology data: None  Procedures: None  Consults: [facility]  Subjective: Remains uncooperative-however will answer 1-2 questions appropriately if persistent with questions.  Per sitter-she was able to get him to drink Ensure, and some soup.  No family at bedside.  While I was in the room-he appeared to hear his wife's voice-would not elaborate further-also appeared to be having some visual hallucinations-and would not elaborate if he was seeing someone.  Objective: Vitals: Blood pressure 112/85, pulse (!) 103, temperature 98.5 F (36.9 C), temperature source Axillary, resp. rate 15, height 5\' 10"  (1.778 m), weight 74.1 kg, SpO2 99 %.   Exam: Gen Exam:not in any distress HEENT:atraumatic, normocephalic Chest: B/L clear to auscultation anteriorly CVS:S1S2 regular Abdomen:soft non tender, non distended Extremities:no edema Neurology: Non focal Skin: no rash  Pertinent Labs/Radiology:    Latest Ref Rng & Units 10/08/2022   12:43 AM 10/07/2022    2:23 PM 10/14/2014    4:10 PM  CBC  WBC 4.0 - 10.5 K/uL 8.3  10.0  10.7    Hemoglobin 13.0 - 17.0 g/dL 16.1  09.6  04.5   Hematocrit 39.0 - 52.0 % 42.3  45.5  37.1   Platelets 150 - 400 K/uL 159  201  206     Lab Results  Component Value Date   NA 144 10/09/2022   K 3.3 (L) 10/09/2022   CL 102 10/09/2022   CO2 27 10/09/2022      Assessment/Plan: Nontraumatic rhabdomyolysis Secondary to immobility-probably recurrent falls CK has trended down with just supportive care Stop all IVF-as ejection fraction significantly reduced Allow CK to trend down spontaneously. Follow CK trend.   AKI Hemodynamically mediated Resolved.  Hypernatremia Secondary to poor oral intake Improved with gentle IV fluid hydration KVO IV fluids-and encourage oral intake.  Hypokalemia Likely due to EtOH use Continue to replete/recheck.  Paroxysmal atrial fibrillation with RVR Had a brief run of A-fib with RVR on 6/11-maintaining sinus rhythm since then Not a candidate for anticoagulation at this point-noncompliant-psych issues prevent reliable oral intake-he has been refusing his oral medications. TSH stable Echo as above Cardiology following  Newly diagnosed  chronic HFrEF Euvolemic Given active psych issues-doubt further workup is  required.  He currently is using all oral medications.  Once psych issues stabilize-we can contemplate starting GDMT medications.  Minimally elevated troponin Demand ischemia Echo with suppressed EF-see above-not a candidate at this point to undergo any sort of ischemic evaluation.  His psych issues will need to be stabilized first.  CAD s/p PCI to RCA 2010 Does not seem to  have anginal symptoms-although poor historian-very uncooperative. Continue aspirin/beta-blocker Hold statin due to rhabdomyolysis  History of EtOH use Unclear when his last drink was Although agitated-uncooperative-he does not have any overt EtOH withdrawal symptoms Ativan per CIWA protocol  Vitamin B12 deficiency Continue supplementation  Failure to  thrive Self-neglect-with refusal of care/agitation Recurrent falls IVC by family PT/OT/nutrition eval Continue high-dose IV thiamine x 3 days-to cover for Korsakoff psychosis ?  Had auditory/visual hallucinations earlier this morning-psychiatry team following-will await recommendations. His oral intake remains poor-although bedside sitter was able to get him to drink Ensure/juice-he probably will require NG tube placement for enteral feeding/medication administration.  Will reach out to his family.  BMI Estimated body mass index is 23.44 kg/m as calculated from the following:   Height as of this encounter: 5\' 10"  (1.778 m).   Weight as of this encounter: 74.1 kg.   Code status:   Code Status: Full Code   DVT Prophylaxis: enoxaparin (LOVENOX) injection 40 mg Start: 10/07/22 1915   Family Communication: None at bedside   Disposition Plan: Status is: Inpatient Remains inpatient appropriate because: Severity of illness   Planned Discharge Destination:Skilled nursing facility   Diet: Diet Order             DIET FINGER FOODS Room service appropriate? No; Fluid consistency: Thin  Diet effective now                     Antimicrobial agents: Anti-infectives (From admission, onward)    None        MEDICATIONS: Scheduled Meds:  cyanocobalamin  1,000 mcg Subcutaneous Q2000   enoxaparin (LOVENOX) injection  40 mg Subcutaneous Q24H   feeding supplement  237 mL Oral TID BM   folic acid  1 mg Oral Daily   metoprolol tartrate  2.5 mg Intravenous Q8H   multivitamin with minerals  1 tablet Oral Daily   sodium chloride flush  3 mL Intravenous Q12H   Continuous Infusions:  thiamine (VITAMIN B1) injection 500 mg (10/09/22 1103)   PRN Meds:.acetaminophen **OR** acetaminophen, LORazepam **OR** LORazepam, ondansetron **OR** ondansetron (ZOFRAN) IV, mouth rinse, senna-docusate   I have personally reviewed following labs and imaging studies  LABORATORY DATA: CBC: Recent  Labs  Lab 10/07/22 1423 10/08/22 0043  WBC 10.0 8.3  NEUTROABS 7.8*  --   HGB 14.8 13.7  HCT 45.5 42.3  MCV 95.8 96.8  PLT 201 159     Basic Metabolic Panel: Recent Labs  Lab 10/07/22 1423 10/08/22 0043 10/08/22 0538 10/09/22 0208  NA 147* 151* 148* 144  K 3.9 2.9* 2.8* 3.3*  CL 100 104 108 102  CO2 24 27 25 27   GLUCOSE 147* 124* 118* 113*  BUN 63* 59* 58* 36*  CREATININE 1.84* 1.52* 1.48* 1.08  CALCIUM 10.1 9.4 8.9 9.4  MG 2.6*  --  2.3 2.0     GFR: Estimated Creatinine Clearance: 63.8 mL/min (by C-G formula based on SCr of 1.08 mg/dL).  Liver Function Tests: Recent Labs  Lab 10/07/22 1423 10/08/22 0043 10/09/22 0208  AST 52* 63* 57*  ALT 17 16 15   ALKPHOS 63 53 50  BILITOT 1.2 1.0 0.8  PROT 7.4 6.3* 5.9*  ALBUMIN 3.6 2.9* 2.7*    No results for input(s): "LIPASE", "AMYLASE" in the last 168 hours. No results for input(s): "AMMONIA" in the last 168 hours.  Coagulation Profile: No results for input(s): "INR", "PROTIME" in the last 168 hours.  Cardiac Enzymes: Recent Labs  Lab 10/07/22 1423  10/08/22 0043 10/09/22 0208  CKTOTAL 2,363* 3,188* 1,876*     BNP (last 3 results) No results for input(s): "PROBNP" in the last 8760 hours.  Lipid Profile: No results for input(s): "CHOL", "HDL", "LDLCALC", "TRIG", "CHOLHDL", "LDLDIRECT" in the last 72 hours.  Thyroid Function Tests: Recent Labs    10/08/22 0043  TSH 0.840     Anemia Panel: Recent Labs    10/08/22 0043  VITAMINB12 187     Urine analysis:    Component Value Date/Time   COLORURINE AMBER (A) 10/08/2022 1745   APPEARANCEUR HAZY (A) 10/08/2022 1745   LABSPEC 1.025 10/08/2022 1745   PHURINE 5.0 10/08/2022 1745   GLUCOSEU NEGATIVE 10/08/2022 1745   HGBUR LARGE (A) 10/08/2022 1745   BILIRUBINUR SMALL (A) 10/08/2022 1745   KETONESUR 5 (A) 10/08/2022 1745   PROTEINUR 100 (A) 10/08/2022 1745   NITRITE NEGATIVE 10/08/2022 1745   LEUKOCYTESUR NEGATIVE 10/08/2022 1745     Sepsis Labs: Lactic Acid, Venous    Component Value Date/Time   LATICACIDVEN 1.6 10/07/2022 2000    MICROBIOLOGY: No results found for this or any previous visit (from the past 240 hour(s)).  RADIOLOGY STUDIES/RESULTS: ECHOCARDIOGRAM COMPLETE  Result Date: 10/08/2022    ECHOCARDIOGRAM REPORT   Patient Name:   Gabriel Becker Date of Exam: 10/08/2022 Medical Rec #:  [id number]     Height:       70.0 in Accession #:    [id number]    Weight:       163.4 lb Date of Birth:  1951/03/14     BSA:          1.915 m Patient Age:    71 years      BP:           96/83 mmHg Patient Gender: M             HR:           108 bpm. Exam Location:  Inpatient Procedure: 2D Echo, Cardiac Doppler and Color Doppler Indications:    Atrial Fibrillation  History:        Patient has no prior history of Echocardiogram examinations. CAD                 and Previous Myocardial Infarction, Arrythmias:Tachycardia; Risk                 Factors:Dyslipidemia, Hypertension, Former Smoker and alcohol                 abuse.  Sonographer:    Wallie Char Referring Phys: [phone] [facility]  Sonographer Comments: Technically challenging study due to limited acoustic windows. Image acquisition challenging due to uncooperative patient. IMPRESSIONS  1. Left ventricular ejection fraction, by estimation, is 20 to 25%. The left ventricle has severely decreased function. Left ventricular endocardial border not optimally defined to evaluate regional wall motion. Left ventricular diastolic function could  not be evaluated.  2. Right ventricular systolic function is normal. The right ventricular size is normal. There is normal pulmonary artery systolic pressure. The estimated right ventricular systolic pressure is 24.5 mmHg.  3. The mitral valve was not well visualized. No evidence of mitral valve regurgitation.  4. The aortic valve was not well visualized. Aortic valve regurgitation is mild. No aortic stenosis is present.  5. The inferior vena  cava is normal in size with greater than 50% respiratory variability, suggesting right atrial pressure of 3 mmHg. FINDINGS  Left Ventricle: Left ventricular ejection fraction, by  estimation, is 20 to 25%. The left ventricle has severely decreased function. Left ventricular endocardial border not optimally defined to evaluate regional wall motion. The left ventricular internal cavity size was normal in size. There is no left ventricular hypertrophy. Left ventricular diastolic function could not be evaluated due to atrial fibrillation. Left ventricular diastolic function could not be evaluated. Right Ventricle: The right ventricular size is normal. No increase in right ventricular wall thickness. Right ventricular systolic function is normal. There is normal pulmonary artery systolic pressure. The tricuspid regurgitant velocity is 2.32 m/s, and  with an assumed right atrial pressure of 3 mmHg, the estimated right ventricular systolic pressure is 24.5 mmHg. Left Atrium: Left atrial size was not well visualized. Right Atrium: Right atrial size was not well visualized. Pericardium: Trivial pericardial effusion is present. Mitral Valve: The mitral valve was not well visualized. No evidence of mitral valve regurgitation. MV peak gradient, 3.8 mmHg. The mean mitral valve gradient is 1.5 mmHg. Tricuspid Valve: The tricuspid valve is grossly normal. Tricuspid valve regurgitation is trivial. No evidence of tricuspid stenosis. Aortic Valve: The aortic valve was not well visualized. Aortic valve regurgitation is mild. Aortic regurgitation PHT measures 573 msec. No aortic stenosis is present. Aortic valve mean gradient measures 2.0 mmHg. Aortic valve peak gradient measures 4.2 mmHg. Aortic valve area, by VTI measures 2.17 cm. Pulmonic Valve: The pulmonic valve was not well visualized. Aorta: The aortic root is normal in size and structure. Venous: The inferior vena cava is normal in size with greater than 50% respiratory  variability, suggesting right atrial pressure of 3 mmHg. IAS/Shunts: The interatrial septum was not well visualized.  LEFT VENTRICLE PLAX 2D LVOT diam:     1.80 cm     Diastology LV SV:         27          LV e' medial:    5.58 cm/s LV SV Index:   14          LV E/e' medial:  13.6 LVOT Area:     2.54 cm    LV e' lateral:   5.58 cm/s                            LV E/e' lateral: 13.6  LV Volumes (MOD) LV vol d, MOD A2C: 53.1 ml LV vol d, MOD A4C: 56.2 ml LV vol s, MOD A2C: 42.0 ml LV vol s, MOD A4C: 45.7 ml LV SV MOD A2C:     11.1 ml LV SV MOD A4C:     56.2 ml LV SV MOD BP:      11.3 ml RIGHT VENTRICLE RV Basal diam:  2.80 cm RV S prime:     32.50 cm/s TAPSE (M-mode): 1.5 cm LEFT ATRIUM             Index       RIGHT ATRIUM          Index LA Vol (A2C):   12.7 ml 6.63 ml/m  RA Area:     9.33 cm LA Vol (A4C):   11.8 ml 6.16 ml/m  RA Volume:   18.90 ml 9.87 ml/m LA Biplane Vol: 12.9 ml 6.74 ml/m  AORTIC VALVE AV Area (Vmax):    1.84 cm AV Area (Vmean):   1.85 cm AV Area (VTI):     2.17 cm AV Vmax:           102.45 cm/s AV Vmean:  70.850 cm/s AV VTI:            0.126 m AV Peak Grad:      4.2 mmHg AV Mean Grad:      2.0 mmHg LVOT Vmax:         74.10 cm/s LVOT Vmean:        51.600 cm/s LVOT VTI:          0.108 m LVOT/AV VTI ratio: 0.85 AI PHT:            573 msec  AORTA Ao Root diam: 3.50 cm MITRAL VALVE               TRICUSPID VALVE MV Area (PHT): 6.63 cm    TR Peak grad:   21.5 mmHg MV Area VTI:   2.18 cm    TR Vmax:        232.00 cm/s MV Peak grad:  3.8 mmHg MV Mean grad:  1.5 mmHg    SHUNTS MV Vmax:       0.98 m/s    Systemic VTI:  0.11 m MV Vmean:      50.1 cm/s   Systemic Diam: 1.80 cm MV Decel Time: 115 msec MV E velocity: 75.65 cm/s MV A velocity: 89.40 cm/s MV E/A ratio:  0.85 Lennie Odor MD Electronically signed by Lennie Odor MD Signature Date/Time: 10/08/2022/10:26:39 AM    Final    CT Head Wo Contrast  Result Date: 10/07/2022 CLINICAL DATA:  Falls, failure to thrive. EXAM: CT HEAD  WITHOUT CONTRAST TECHNIQUE: Contiguous axial images were obtained from the base of the skull through the vertex without intravenous contrast. RADIATION DOSE REDUCTION: This exam was performed according to the departmental dose-optimization program which includes automated exposure control, adjustment of the mA and/or kV according to patient size and/or use of iterative reconstruction technique. COMPARISON:  None Available. FINDINGS: Brain: No intracranial hemorrhage, mass effect, or midline shift. Moderate generalized atrophy. No hydrocephalus. The basilar cisterns are patent. Moderate to advanced chronic small vessel ischemia. Small remote lacunar infarcts in the basal ganglia. No evidence of territorial infarct or acute ischemia. No extra-axial or intracranial fluid collection. Vascular: Atherosclerosis of skullbase vasculature without hyperdense vessel or abnormal calcification. Skull: No fracture or focal lesion. Sinuses/Orbits: No acute findings. Tiny mucous retention cysts in the maxillary sinuses. No mastoid effusion. Other: None. IMPRESSION: 1. No acute intracranial abnormality. 2. Moderate atrophy and chronic small vessel ischemia. Electronically Signed   By: Narda Rutherford M.D.   On: 10/07/2022 16:45   CT Cervical Spine Wo Contrast  Result Date: 10/07/2022 CLINICAL DATA:  Falls and failure to thrive EXAM: CT CERVICAL SPINE WITHOUT CONTRAST TECHNIQUE: Multidetector CT imaging of the cervical spine was performed without intravenous contrast. Multiplanar CT image reconstructions were also generated. RADIATION DOSE REDUCTION: This exam was performed according to the departmental dose-optimization program which includes automated exposure control, adjustment of the mA and/or kV according to patient size and/or use of iterative reconstruction technique. COMPARISON:  None Available. FINDINGS: Alignment: Straightening of normal lordosis. No traumatic subluxation. Skull base and vertebrae: No acute fracture.  Vertebral body heights are maintained. The dens and skull base are intact. Soft tissues and spinal canal: No prevertebral fluid or swelling. No visible canal hematoma. Disc levels: Moderate diffuse degenerative disc disease with large anterior spurs. Mild to moderate multilevel facet hypertrophy. No high-grade canal stenosis. Upper chest: No acute findings. Other: None. IMPRESSION: Moderate degenerative change throughout the cervical spine without acute fracture or subluxation. Electronically Signed  By: Narda Rutherford M.D.   On: 10/07/2022 16:43   DG Pelvis Portable  Result Date: 10/07/2022 CLINICAL DATA:  Fall EXAM: PORTABLE PELVIS 1 VIEWS COMPARISON:  None Available. FINDINGS: Limited evaluation of the right hip due to patient rotation, additionally portions of the proximal right femur and right pelvis are excluded from the field of view. No evidence of left hip fracture. Degenerative changes of the partially visualized lumbar spine. Mild degenerative changes of the left hip. Vascular calcifications. Soft tissues are unremarkable. IMPRESSION: 1. Limited evaluation of the right hip due to patient rotation, additionally portions of the proximal right femur and right pelvis are excluded from the field of view. Consider repeat imaging. 2. No evidence of left hip fracture. Electronically Signed   By: Allegra Lai M.D.   On: 10/07/2022 15:20   DG Chest Portable 1 View  Result Date: 10/07/2022 CLINICAL DATA:  Fall EXAM: PORTABLE CHEST 1 VIEW COMPARISON:  Chest x-ray dated August 26, 2013 FINDINGS: Patient rotation somewhat limits evaluation. Cardiac and mediastinal contours within normal limits. Lungs are clear. No evidence of pleural effusion or pneumothorax. No displaced fracture. IMPRESSION: No acute cardiopulmonary process. Electronically Signed   By: Allegra Lai M.D.   On: 10/07/2022 15:17     LOS: 2 days   Jeoffrey Massed, MD  [facility]    To contact the attending provider  between 7A-7P or the covering provider during after hours 7P-7A, please log into the web site [email or web address] and access using universal [place] password for that web site. If you do not have the password, please call the hospital operator.  10/09/2022, 1:46 PM

## 2022-10-09 NOTE — Progress Notes (Signed)
Large UOP, incontinent. Post void residual, 0 ml. Pt combative, verbally aggressive. Declining to participate in assessments.

## 2022-10-09 NOTE — Consult Note (Signed)
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 male admitted medically on 10/07/2022  1:56 PM under IVC for failure to thrive and multiple falls at home. He carries no known psychiatric diagnosis and has a past medical history of  alcohol abuse, CAD-s/p PCI 2010, HTN, HLD .Psychiatry was consulted for "IVC'd by family-self neglect-?some sort of underlying psych illness at baseline-argumentative, agitated, violent at times" by Dr. Jeoffrey Massed on 10/08/22.    His current presentation of irritability and agitation is most consistent with neurocognitive disorder and prolonged grief. Current not on any psychotropic medications. He was not compliant with medications prior to admission as evidenced by wife. On initial examination, patient is disgruntled and unpleasant. Per sitter he has been appearing to stare and then begin to talk to people. He has been having conversations with his wife.   He does have other medical etiologies that are likely contributing to his current presentation to the clue delirium, failure to thrive, metabolic abnormalities. Based on wife's timeline of patient's symptoms, many of his cognitive problems are likely chronic rather than acute.  Suspect he is a highly unpleasant individual at baseline, and although he is refusing care he does not appear to be agitated and less being disturbed.  On examination patient is disoriented.  He is only alert and oriented to self.  When assessing for where "dots house", date "31st"; he fails to provide answers for months, current president, situation, and when.  He does become increasingly frustrated as the psychiatric assessment continues, however does not make any physical threatening gestures.  It should be noted patient is also in bilateral soft wrist  restraints.  He does appear to be responding to external stimuli as he is noted to be looking and staring throughout the room.  However he refuses to answer if he is seeing things.  He also is observed to be mumbling throughout psychiatric reevaluation, however unclear if he is disgruntled and cursing at provider or having conversations with others that are not present.   As noted above patient lacks capacity, due to failure to answer capacity questions.     Please see plan below for detailed recommendations.   Diagnoses:  Active Hospital problems: Principal Problem:   Non-traumatic rhabdomyolysis Active Problems:   Hyperlipidemia   Essential hypertension   CAD (coronary artery disease)   Alcohol abuse   Renal insufficiency   Involuntary commitment   Failure to thrive in adult     Plan   ## Psychiatric Medication Recommendations:  -- Consider mirtazapine if patient is amenable to taking PO -- May consider low-dose Seroquel 12.5 mg p.o. twice daily, if patient becomes physically agitated or aggressive with nursing staff.  However at the time of this evaluation patient does not appear to be of imminent danger to self or others, that requires nonemergency forced medication.  He lacks capacity.  ## Medical Decision Making Capacity:  Lacks capacity, consider contacting next-of-kin.  If next-of-kin is not available, consider APS report if filing for guardianship.  ## Further Work-up:  -- Urinalysis with hematuria, small amount of bilirubin, and proteinuria.  Urine drug screen negative  -- most recent EKG on 10/08/22 had QtC of 436 -- Pertinent labwork reviewed earlier this admission includes: Na 148, K 2.8, Cr 1.48, B12 187, B1 pending, TSH 0.85, CK  3188 -Recommend palliative medicine consult if patient continues to lack nutrition. -Continue to assess for additional metabolic/organic causes of delirium; as there appears to be no evidence of the previously documented dementia however  extensive history of alcohol abuse, essential hypertension, coronary artery disease, hypercholesterolemia.  After additional processes are eliminated inpatient medically stable, can review and consider for inpatient psychiatric hospitalization.  ## Disposition:  -- There are no current psychiatric contraindications to discharge at this time  ## Behavioral / Environmental:  --  Delirium Precautions: Delirium Interventions for Nursing and Staff: - RN to open blinds every AM. - To Bedside: Glasses, hearing aide, and pt's own shoes. Make available to patients. when possible and encourage use. - Encourage po fluids when appropriate, keep fluids within reach. - OOB to chair with meals. - Passive ROM exercises to all extremities with AM & PM care. - RN to assess orientation to person, time and place QAM and PRN. - Recommend extended visitation hours with familiar family/friends as feasible. - Staff to minimize disturbances at night. Turn off television when pt asleep or when not in use.   It is worth noting that the IVC is the only legal mechansim by which incapacitated patients who require inpt medical attention can be compelled to remain in the hospital; presence of IVC alone does NOT mean that pt will ultimately require psychiatric hospitalization. This pt does not have an underlying psychiatric disturbance (outside of likely dementia) as far as I can tell.     ## Safety and Observation Level:  - Based on my clinical evaluation, I estimate the patient to be at low risk of self harm in the current setting - Continue safety sitter for fall risk and delirium   Suicide risk assessment  Patient has following modifiable risk factors for suicide: medication noncompliance, which we are addressing by collaborating with wife.   Patient has following non-modifiable or demographic risk factors for suicide: male gender  Patient has the following protective factors against suicide: Supportive family,  Supportive friends, and no history of suicide attempts   Thank you for this consult request. Recommendations have been communicated to the primary team.  We will continue to follow at this time.   Maryagnes Amos, FNP  Psychiatric and Social History   Relevant Aspects of Hospital Course:  Admitted on 10/07/2022 for failure to thrive. Went into afib with RVR but now normal rate. Also found to have EF 20-25% on echo.    Patient Report: No change in patient report or reassessment since assessment by previous provider.  Patient was grudgingly cooperative and refused to participate in most of the assessment.  He did ask " did you come out of the room again?  When did you come from?"  Further clarification was sought however patient turned head and made no further eye contact.  He continued to refuse and to engage with provider.  He continues to remain in 2-point wrist soft bilateral wrist restraints.  Safety sitter at bedside.   Psych ROS:  Depression: unknown Anxiety:  unknown Mania (lifetime and current): unknown Psychosis: (lifetime and current): denies  Collateral information:  Contacted Antario Delfino at [phone] on 10/08/22 Patient has stopped caring for himself for several months. He has occasionally stopped eating for a week for ~2 years. Acutely, he has stopped eating for the past 3 weeks. She has found him hiding his food in the trash or refusing to eat. He will sit on the couch and not move from there. He has  occasionally complained of leg numbness and has fallen multiple times but continued to refuse help despite wife asking for him to seek medical help. He normally "curses out" healthcare providers but his current emotional lability has encompassed cursing out his wife and best friends which is very uncharacteristic of him. Patient had abruptly stopped drinking alcohol a month ago. She states he drank at least a half a gallon of vodka per week prior to this. He had also  recently become urine incontinent which is new for him. He has had poor memory for some time now where patient will ask from something of wife and when wife comes back moment later, patient will have forgotten he asked for it.  Patient has had multiple losses where in past decade including daughter, son-in-law, and brother. Wife notes patient will occasionally stare at photos of deceased family members and repeatedly state he has "lost everyone". Wife is very concerned that patient is refusing to care for self and refusing for anyone to help him.  Psychiatric History:  Information collected from chart review  Prev Dx/Sx: alcohol abuse Current Psych Provider: none Home Meds (current): none Previous Med Trials: unknown Therapy: none  Prior ECT: unknown Prior Psych Hospitalization: unknown  Prior Self Harm: unknown Prior Violence: unknown  Family Psych History: unknown Family Hx suicide: unknown  Social History:  Legal Hx: unknown Living Situation: lives with wife Spiritual Hx: unknown Access to weapons: will need to ask wife  Substance History Tobacco use: unknown Alcohol use: 1/2 gallon of vodka per week until a month ago Drug use: unknown   Exam Findings   Psychiatric Specialty Exam:  Presentation  General Appearance: Disheveled  Eye Contact:None  Speech:Clear and Coherent; Slow  Speech Volume:Decreased   Mood and Affect  Mood:Labile; Irritable  Affect:Blunt; Labile   Thought Process  Thought Processes:Coherent  Descriptions of Associations:Intact  Orientation:Other (comment) (A&O x1)  Thought Content:Illogical  Hallucinations:Hallucinations: -- (Does not answer)  Ideas of Reference:-- (Unable to assess)  Suicidal Thoughts:Suicidal Thoughts: -- (UTA)  Homicidal Thoughts:Homicidal Thoughts: -- (UTA)   Sensorium  Memory:Remote Poor; Immediate Poor; Recent Poor  Judgment:Impaired  Insight:Lacking   Executive Functions   Concentration:Fair  Attention Span:Fair  Recall:Poor  Fund of Knowledge:Fair  Language:Fair   Psychomotor Activity  Psychomotor Activity:Psychomotor Activity: Decreased   Assets  Assets:Resilience; Manufacturing systems engineer; Financial Resources/Insurance; Housing   Sleep  Sleep:Sleep: Fair    Physical Exam: Vital signs:  Temp:  [97.6 F (36.4 C)-98.6 F (37 C)] 98.5 F (36.9 C) (06/12 1200) Pulse Rate:  [93-115] 115 (06/12 1400) Resp:  [11-27] 21 (06/12 1400) BP: (97-165)/(66-105) 116/105 (06/12 1400) SpO2:  [91 %-99 %] 98 % (06/12 1400) Physical Exam Vitals and nursing note reviewed.  Constitutional:      General: He is awake.     Appearance: He is underweight. He is ill-appearing.     Interventions: He is restrained.  Neurological:     Mental Status: He is disoriented.  Psychiatric:        Behavior: Behavior is uncooperative.     Blood pressure (!) 116/105, pulse (!) 115, temperature 98.5 F (36.9 C), temperature source Axillary, resp. rate (!) 21, height 5\' 10"  (1.778 m), weight 74.1 kg, SpO2 98 %. Body mass index is 23.44 kg/m.   Other History   These have been pulled in through the EMR, reviewed, and updated if appropriate.   Family History:  Unknown family psychiatric history The patient's family history includes CVA (age of onset: 59) in his  mother; Cancer in his sister; Cancer (age of onset: 59) in his father; Coronary artery disease in an other family member.  Medical History: Past Medical History:  Diagnosis Date   Alcohol abuse    CAD (coronary artery disease)    a. inf STEMI 01/2009 => LHC - dRCA occl (tx with Xience 2.8x18 mm DES), pLAD 30, PL1 70, PL2 80, EF 55%   Hyperlipidemia    Hypertension    ST elevation myocardial infarction (STEMI) of true posterior wall [facility])     Surgical History: Had a stent placed due to STEMI in 2010  Medications:   Current Facility-Administered Medications:    acetaminophen (TYLENOL) tablet 650 mg,  650 mg, Oral, Q6H PRN **OR** acetaminophen (TYLENOL) suppository 650 mg, 650 mg, Rectal, Q6H PRN, Allena Katz, Vishal R, MD   cyanocobalamin (VITAMIN B12) injection 1,000 mcg, 1,000 mcg, Subcutaneous, Q2000, Ghimire, Shanker M, MD, 1,000 mcg at 10/08/22 2114   enoxaparin (LOVENOX) injection 40 mg, 40 mg, Subcutaneous, Q24H, Patel, Vishal R, MD, 40 mg at 10/08/22 1742   feeding supplement (ENSURE ENLIVE / ENSURE PLUS) liquid 237 mL, 237 mL, Oral, TID BM, Ghimire, Shanker M, MD, 237 mL at 10/09/22 1306   folic acid (FOLVITE) tablet 1 mg, 1 mg, Oral, Daily, Elpidio Anis, Benetta Spar C, MD   LORazepam (ATIVAN) tablet 1-4 mg, 1-4 mg, Oral, Q1H PRN **OR** LORazepam (ATIVAN) injection 1-4 mg, 1-4 mg, Intravenous, Q1H PRN, Vivien Rossetti C, MD, 1 mg at 10/07/22 1556   metoprolol tartrate (LOPRESSOR) injection 2.5 mg, 2.5 mg, Intravenous, Q8H, Ghimire, Shanker M, MD, 2.5 mg at 10/09/22 1330   multivitamin with minerals tablet 1 tablet, 1 tablet, Oral, Daily, Vivien Rossetti C, MD   ondansetron (ZOFRAN) tablet 4 mg, 4 mg, Oral, Q6H PRN **OR** ondansetron (ZOFRAN) injection 4 mg, 4 mg, Intravenous, Q6H PRN, Charlsie Quest, MD   Oral care mouth rinse, 15 mL, Mouth Rinse, PRN, Ghimire, Werner Lean, MD   senna-docusate (Senokot-S) tablet 1 tablet, 1 tablet, Oral, QHS PRN, Allena Katz, Vishal R, MD   sodium chloride flush (NS) 0.9 % injection 3 mL, 3 mL, Intravenous, Q12H, Patel, Vishal R, MD, 3 mL at 10/09/22 0904   thiamine (VITAMIN B1) 500 mg in sodium chloride 0.9 % 50 mL IVPB, 500 mg, Intravenous, Q12H, Ghimire, Shanker M, MD, Last Rate: 100 mL/hr at 10/09/22 1103, 500 mg at 10/09/22 1103  Allergies: No Known Allergies

## 2022-10-09 NOTE — Progress Notes (Signed)
   Patient remains in sinus rhythm.   No further episodes of afib  BP a little high this morning    Follow for now   Will continue to follow No new recommendations.    Signed, Dietrich Pates, MD  10/09/2022, 6:10 AM

## 2022-10-10 ENCOUNTER — Encounter (HOSPITAL_COMMUNITY): Payer: Self-pay | Admitting: Internal Medicine

## 2022-10-10 DIAGNOSIS — R627 Adult failure to thrive: Secondary | ICD-10-CM | POA: Diagnosis not present

## 2022-10-10 DIAGNOSIS — Z66 Do not resuscitate: Secondary | ICD-10-CM

## 2022-10-10 DIAGNOSIS — M6282 Rhabdomyolysis: Secondary | ICD-10-CM | POA: Diagnosis not present

## 2022-10-10 DIAGNOSIS — E785 Hyperlipidemia, unspecified: Secondary | ICD-10-CM | POA: Diagnosis not present

## 2022-10-10 DIAGNOSIS — I5020 Unspecified systolic (congestive) heart failure: Secondary | ICD-10-CM | POA: Diagnosis not present

## 2022-10-10 DIAGNOSIS — I4891 Unspecified atrial fibrillation: Secondary | ICD-10-CM | POA: Diagnosis not present

## 2022-10-10 DIAGNOSIS — F1010 Alcohol abuse, uncomplicated: Secondary | ICD-10-CM | POA: Diagnosis not present

## 2022-10-10 LAB — BASIC METABOLIC PANEL
Anion gap: 8 (ref 5–15)
BUN: 20 mg/dL (ref 8–23)
CO2: 31 mmol/L (ref 22–32)
Calcium: 9 mg/dL (ref 8.9–10.3)
Chloride: 106 mmol/L (ref 98–111)
Creatinine, Ser: 0.74 mg/dL (ref 0.61–1.24)
GFR, Estimated: >60 mL/min (ref >60)
Glucose, Bld: 106 mg/dL — ABNORMAL HIGH (ref 70–99)
Potassium: 4.3 mmol/L (ref 3.5–5.1)
Sodium: 145 mmol/L (ref 135–145)

## 2022-10-10 LAB — CK: Total CK: 553 U/L — ABNORMAL HIGH (ref 49–397)

## 2022-10-10 LAB — VITAMIN B1: Vitamin B1 (Thiamine): 294.4 nmol/L — ABNORMAL HIGH (ref 66.5–200.0)

## 2022-10-10 LAB — MAGNESIUM: Magnesium: 1.9 mg/dL (ref 1.7–2.4)

## 2022-10-10 LAB — PHOSPHORUS: Phosphorus: 2.5 mg/dL (ref 2.5–4.6)

## 2022-10-10 MED ORDER — THIAMINE HCL 100 MG/ML IJ SOLN
100.0000 mg | Freq: Every day | INTRAMUSCULAR | Status: DC
  Administered 2022-10-11 – 2022-10-14 (×4): 100 mg via INTRAVENOUS
  Filled 2022-10-10 (×4): qty 2

## 2022-10-10 MED ORDER — LORAZEPAM 2 MG/ML IJ SOLN
1.0000 mg | INTRAMUSCULAR | Status: AC | PRN
  Administered 2022-10-11: 2 mg via INTRAVENOUS
  Filled 2022-10-10: qty 1

## 2022-10-10 MED ORDER — LORAZEPAM 1 MG PO TABS: 1.0000 mg | ORAL_TABLET | ORAL | Status: AC | PRN

## 2022-10-10 NOTE — Consult Note (Signed)
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 male admitted medically on 10/07/2022  1:56 PM under IVC for failure to thrive and multiple falls at home. He carries no known psychiatric diagnosis and has a past medical history of  alcohol abuse, CAD-s/p PCI 2010, HTN, HLD .Psychiatry was consulted for "IVC'd by family-self neglect-?some sort of underlying psych illness at baseline-argumentative, agitated, violent at times" by Dr. Jeoffrey Massed on 10/08/22.   His current presentation of irritability and agitation is most consistent with neurocognitive disorder and prolonged grief. Current not on any psychotropic medications. He was not compliant with medications prior to admission as evidenced by wife.   Patient continues to present with little to no changes on psychiatric evaluation. Today he remains disgruntled and uncooperative, expressing his unwillingness to cooperate with psychiatric eval by means of a cold stern stare. He chooses not to answer any questions that he is prompted with, instead curses and mumbles and turns away from the provider. He continues to present with bilateral soft wrist restraints.He is observed to be looking around and endorses auditory hallucinations by way of saying " where is my wife at? Don't you hear her talking, she is around here somewhere. "  He does appear to be responding to external stimuli as he is noted to be looking and staring throughout the room.  However he continues to not answer questions about seeing things such as his wife. Patient does not appear to be catatonic on exam, although it is limited there appears to be no evidence of immobility, stupor, mutism,or rigidity.   DDX : Dementia, Akathisia (psychomotor agitation), Encephalopathy. MDD with psychosis,  WKS(abrupt cessation of alcohol one month) B1 level is greatly elevated.     Please see plan below for detailed recommendations.   Diagnoses:  Active Hospital problems: Principal Problem:   Non-traumatic rhabdomyolysis Active Problems:   Hyperlipidemia   Essential hypertension   CAD (coronary artery disease)   Alcohol abuse   Renal insufficiency   Involuntary commitment   Failure to thrive in adult     Plan   ## Psychiatric Medication Recommendations:  -- Consider mirtazapine if patient is amenable to taking PO -- May consider low-dose Seroquel 12.5 mg p.o. twice daily, if patient becomes physically agitated or aggressive with nursing staff. Please review black box warning with family although he is now a DNAR/DNRI  However at the time of this evaluation patient does not appear to be of imminent danger to self or others, that requires nonemergency forced medication.  He lacks capacity.  ## Medical Decision Making Capacity:  Lacks capacity, consider contacting next-of-kin.  If next-of-kin is not available, consider APS report if filing for guardianship.  ## Further Work-up:  -- Urinalysis with hematuria, small amount of bilirubin, and proteinuria.  Urine drug screen negative  -- most recent EKG on 10/08/22 had QtC of 436 -- Pertinent labwork reviewed earlier this admission includes: Na 148, K 2.8, Cr 1.48, B12 187, B1 294, TSH 0.85, CK 553 -PMT consulted, and will be involved going forward. -Continue to assess for additional metabolic/organic causes of delirium; as there appears to be no evidence of the previously documented dementia however extensive history of alcohol abuse, essential hypertension, coronary artery disease, hypercholesterolemia.  After additional processes are eliminated inpatient medically stable, can review and  consider for inpatient psychiatric hospitalization.  ## Disposition:  -- There are no current psychiatric contraindications to discharge at this  time  ## Behavioral / Environmental:  --  Delirium Precautions: Delirium Interventions for Nursing and Staff: - RN to open blinds every AM. - To Bedside: Glasses, hearing aide, and pt's own shoes. Make available to patients. when possible and encourage use. - Encourage po fluids when appropriate, keep fluids within reach. - OOB to chair with meals. - Passive ROM exercises to all extremities with AM & PM care. - RN to assess orientation to person, time and place QAM and PRN. - Recommend extended visitation hours with familiar family/friends as feasible. - Staff to minimize disturbances at night. Turn off television when pt asleep or when not in use.   IVC can be rescinded or allow to expire.   ## Safety and Observation Level:  - Based on my clinical evaluation, I estimate the patient to be at low risk of self harm in the current setting - Continue safety sitter for fall risk and delirium   Thank you for this consult request. Recommendations have been communicated to the primary team.  We will sign off at this time.   Maryagnes Amos, FNP  Psychiatric and Social History   Relevant Aspects of Hospital Course:  Admitted on 10/07/2022 for failure to thrive. Went into afib with RVR but now normal rate. Also found to have EF 20-25% on echo.    Patient Report: No change in patient report or reassessment since assessment by this provider.  Patient was grudgingly cooperative and refused to participate in most of the assessment.  H He continues to remain in 2-point wrist soft bilateral wrist restraints.  Safety sitter at bedside.   Psych ROS:  Depression: unknown Anxiety:  unknown Mania (lifetime and current): unknown Psychosis: (lifetime and current): denies  Collateral information:  Contacted Tionne Zervas at [phone] on 10/08/22 Patient has stopped caring for himself for several months. He has occasionally stopped eating for a week for ~2 years. Acutely, he has stopped  eating for the past 3 weeks. She has found him hiding his food in the trash or refusing to eat. He will sit on the couch and not move from there. He has occasionally complained of leg numbness and has fallen multiple times but continued to refuse help despite wife asking for him to seek medical help. He normally "curses out" healthcare providers but his current emotional lability has encompassed cursing out his wife and best friends which is very uncharacteristic of him. Patient had abruptly stopped drinking alcohol a month ago. She states he drank at least a half a gallon of vodka per week prior to this. He had also recently become urine incontinent which is new for him. He has had poor memory for some time now where patient will ask from something of wife and when wife comes back moment later, patient will have forgotten he asked for it.  Patient has had multiple losses where in past decade including daughter, son-in-law, and brother. Wife notes patient will occasionally stare at photos of deceased family members and repeatedly state he has "lost everyone". Wife is very concerned that patient is refusing to care for self and refusing for anyone to help him.  Psychiatric History:  Information collected from chart review  Prev Dx/Sx: alcohol abuse Current Psych Provider: none Home Meds (current): none Previous Med Trials: unknown Therapy: none  Prior ECT: unknown Prior Psych Hospitalization: unknown  Prior Self Harm:  unknown Prior Violence: unknown  Family Psych History: unknown Family Hx suicide: unknown  Social History:  Legal Hx: unknown Living Situation: lives with wife Spiritual Hx: unknown Access to weapons: will need to ask wife  Substance History Tobacco use: unknown Alcohol use: 1/2 gallon of vodka per week until a month ago Drug use: unknown   Exam Findings   Psychiatric Specialty Exam:  Presentation  General Appearance: Disheveled  Eye Contact:None  Speech:Clear  and Coherent; Normal Rate  Speech Volume:Normal   Mood and Affect  Mood:Irritable  Affect:Blunt; Labile   Thought Process  Thought Processes:Coherent; Linear  Descriptions of Associations:Intact  Orientation:Other (comment) (Will not answer)  Thought Content:Other (comment) (Will not answer)  Hallucinations:Hallucinations: -- (will not answer)  Ideas of Reference:-- (Will not answer)  Suicidal Thoughts:Suicidal Thoughts: -- (Will not answer)  Homicidal Thoughts:Homicidal Thoughts: No   Sensorium  Memory:Immediate Poor; Recent Poor; Remote Poor  Judgment:Impaired  Insight:Lacking   Executive Functions  Concentration:Fair  Attention Span:Poor  Recall:Poor  Fund of Knowledge:Fair  Language:Fair   Psychomotor Activity  Psychomotor Activity:Psychomotor Activity: Psychomotor Retardation   Assets  Assets:Resilience; Manufacturing systems engineer; Financial Resources/Insurance; Housing; Social Support   Sleep  Sleep:Sleep: Fair    Physical Exam: Vital signs:  Temp:  [97.9 F (36.6 C)-98.7 F (37.1 C)] 98.7 F (37.1 C) (06/12 2316) Pulse Rate:  [94-119] 102 (06/13 0401) Resp:  [13-22] 14 (06/13 1200) BP: (115-145)/(83-122) 135/98 (06/13 1200) SpO2:  [96 %-98 %] 98 % (06/12 2316) Physical Exam Vitals and nursing note reviewed.  Constitutional:      Appearance: He is underweight.     Interventions: He is restrained.  Neurological:     General: No focal deficit present.     Mental Status: He is alert and easily aroused. He is disoriented.  Psychiatric:        Attention and Perception: He is inattentive.        Mood and Affect: Affect is labile, blunt and angry.        Speech: Speech normal.        Behavior: Behavior is uncooperative and agitated.        Cognition and Memory: Cognition is impaired. Memory is impaired. He exhibits impaired recent memory.     Blood pressure (!) 135/98, pulse (!) 102, temperature 98.7 F (37.1 C), temperature source  Oral, resp. rate 14, height 5\' 10"  (1.778 m), weight 74.1 kg, SpO2 98 %. Body mass index is 23.44 kg/m.   Other History   These have been pulled in through the EMR, reviewed, and updated if appropriate.   Family History:  Unknown family psychiatric history The patient's family history includes CVA (age of onset: 15) in his mother; Cancer in his sister; Cancer (age of onset: 15) in his father; Coronary artery disease in an other family member.  Medical History: Past Medical History:  Diagnosis Date   Alcohol abuse    CAD (coronary artery disease)    a. inf STEMI 01/2009 => LHC - dRCA occl (tx with Xience 2.8x18 mm DES), pLAD 30, PL1 70, PL2 80, EF 55%   Hyperlipidemia    Hypertension    ST elevation myocardial infarction (STEMI) of true posterior wall [facility])     Surgical History: Had a stent placed due to STEMI in 2010  Medications:   Current Facility-Administered Medications:    acetaminophen (TYLENOL) tablet 650 mg, 650 mg, Oral, Q6H PRN **OR** acetaminophen (TYLENOL) suppository 650 mg, 650 mg, Rectal, Q6H PRN, Charlsie Quest, MD  cyanocobalamin (VITAMIN B12) injection 1,000 mcg, 1,000 mcg, Subcutaneous, Q2000, Ghimire, Werner Lean, MD, 1,000 mcg at 10/09/22 2155   enoxaparin (LOVENOX) injection 40 mg, 40 mg, Subcutaneous, Q24H, Allena Katz, Vishal R, MD, 40 mg at 10/09/22 1810   feeding supplement (ENSURE ENLIVE / ENSURE PLUS) liquid 237 mL, 237 mL, Oral, TID BM, Ghimire, Shanker M, MD, 237 mL at 10/09/22 1306   folic acid (FOLVITE) tablet 1 mg, 1 mg, Oral, Daily, Elpidio Anis, Benetta Spar C, MD   LORazepam (ATIVAN) tablet 1-4 mg, 1-4 mg, Oral, Q1H PRN **OR** LORazepam (ATIVAN) injection 1-4 mg, 1-4 mg, Intravenous, Q1H PRN, Vivien Rossetti C, MD, 1 mg at 10/07/22 1556   metoprolol tartrate (LOPRESSOR) injection 2.5 mg, 2.5 mg, Intravenous, Q8H, Ghimire, Shanker M, MD, 2.5 mg at 10/10/22 1301   multivitamin with minerals tablet 1 tablet, 1 tablet, Oral, Daily, Elpidio Anis, Constance Goltz, MD    ondansetron (ZOFRAN) tablet 4 mg, 4 mg, Oral, Q6H PRN **OR** ondansetron (ZOFRAN) injection 4 mg, 4 mg, Intravenous, Q6H PRN, Charlsie Quest, MD   Oral care mouth rinse, 15 mL, Mouth Rinse, PRN, Ghimire, Werner Lean, MD   senna-docusate (Senokot-S) tablet 1 tablet, 1 tablet, Oral, QHS PRN, Allena Katz, Vishal R, MD   sodium chloride flush (NS) 0.9 % injection 3 mL, 3 mL, Intravenous, Q12H, Patel, Vishal R, MD, 3 mL at 10/09/22 2148   thiamine (VITAMIN B1) 500 mg in sodium chloride 0.9 % 50 mL IVPB, 500 mg, Intravenous, Q12H, Ghimire, Shanker M, MD, Last Rate: 100 mL/hr at 10/10/22 0800, 500 mg at 10/10/22 0800   [START ON 10/11/2022] thiamine (VITAMIN B1) injection 100 mg, 100 mg, Intravenous, Daily, Ghimire, Werner Lean, MD  Allergies: No Known Allergies
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
## 2022-10-10 NOTE — Progress Notes (Signed)
Rounding Note    Patient Name: Gabriel Becker Date of Encounter: 10/10/2022  [facility] Cardiologist: New   Subjective   Pt appears comfortable in bed   In restraints   Inpatient Medications    Scheduled Meds:  cyanocobalamin  1,000 mcg Subcutaneous Q2000   enoxaparin (LOVENOX) injection  40 mg Subcutaneous Q24H   feeding supplement  237 mL Oral TID BM   folic acid  1 mg Oral Daily   metoprolol tartrate  2.5 mg Intravenous Q8H   multivitamin with minerals  1 tablet Oral Daily   sodium chloride flush  3 mL Intravenous Q12H   Continuous Infusions:  thiamine (VITAMIN B1) injection 500 mg (10/09/22 2141)   PRN Meds: acetaminophen **OR** acetaminophen, LORazepam **OR** LORazepam, ondansetron **OR** ondansetron (ZOFRAN) IV, mouth rinse, senna-docusate   Vital Signs    Vitals:   10/09/22 2129 10/09/22 2316 10/10/22 0000 10/10/22 0401  BP:  (!) 125/97 (!) 139/105 (!) 140/122  Pulse: (!) 110   (!) 102  Resp:  18  14  Temp: 98.4 F (36.9 C) 98.7 F (37.1 C)    TempSrc: Axillary Oral    SpO2:  98%    Weight:      Height:        Intake/Output Summary (Last 24 hours) at 10/10/2022 0816 Last data filed at 10/10/2022 0600 Gross per 24 hour  Intake 1115.08 ml  Output 850 ml  Net 265.08 ml      10/08/2022   12:00 AM 10/07/2022    2:16 PM 10/14/2014    3:49 PM  Last 3 Weights  Weight (lbs) 163 lb 5.8 oz 162 lb 0.6 oz 166 lb  Weight (kg) 74.1 kg 73.5 kg 75.297 kg      Telemetry    SR - Personally Reviewed  ECG    NO new  - Personally Reviewed  Physical Exam   GEN: very thin 72 yo in no acute distress.   Neck: No JVD Cardiac: RRR, no murmurs, Respiratory: Clear to auscultation bilaterally. GI: Soft, nontender, non-distended  MS: No edema; Pt in soft restraint s  Labs    High Sensitivity Troponin:   Recent Labs  Lab 10/07/22 1423 10/07/22 1603  TROPONINIHS 168* 171*     Chemistry Recent Labs  Lab 10/07/22 1423 10/08/22 0043  10/08/22 0538 10/09/22 0208  NA 147* 151* 148* 144  K 3.9 2.9* 2.8* 3.3*  CL 100 104 108 102  CO2 24 27 25 27   GLUCOSE 147* 124* 118* 113*  BUN 63* 59* 58* 36*  CREATININE 1.84* 1.52* 1.48* 1.08  CALCIUM 10.1 9.4 8.9 9.4  MG 2.6*  --  2.3 2.0  PROT 7.4 6.3*  --  5.9*  ALBUMIN 3.6 2.9*  --  2.7*  AST 52* 63*  --  57*  ALT 17 16  --  15  ALKPHOS 63 53  --  50  BILITOT 1.2 1.0  --  0.8  GFRNONAA 38* 48* 50* >60  ANIONGAP 23* 20* 15 15    Lipids No results for input(s): "CHOL", "TRIG", "HDL", "LABVLDL", "LDLCALC", "CHOLHDL" in the last 168 hours.  Hematology Recent Labs  Lab 10/07/22 1423 10/08/22 0043  WBC 10.0 8.3  RBC 4.75 4.37  HGB 14.8 13.7  HCT 45.5 42.3  MCV 95.8 96.8  MCH 31.2 31.4  MCHC 32.5 32.4  RDW 14.0 13.9  PLT 201 159   Thyroid  Recent Labs  Lab 10/08/22 0043  TSH 0.840    BNPNo  results for input(s): "BNP", "PROBNP" in the last 168 hours.  DDimer No results for input(s): "DDIMER" in the last 168 hours.   Radiology    ECHOCARDIOGRAM COMPLETE  Result Date: 10/08/2022    ECHOCARDIOGRAM REPORT   Patient Name:   KASYN ROLPH Date of Exam: 10/08/2022 Medical Rec #:  [id number]     Height:       70.0 in Accession #:    [id number]    Weight:       163.4 lb Date of Birth:  November 04, 1950     BSA:          1.915 m Patient Age:    71 years      BP:           96/83 mmHg Patient Gender: M             HR:           108 bpm. Exam Location:  Inpatient Procedure: 2D Echo, Cardiac Doppler and Color Doppler Indications:    Atrial Fibrillation  History:        Patient has no prior history of Echocardiogram examinations. CAD                 and Previous Myocardial Infarction, Arrythmias:Tachycardia; Risk                 Factors:Dyslipidemia, Hypertension, Former Smoker and alcohol                 abuse.  Sonographer:    Wallie Char Referring Phys: [phone] [facility]  Sonographer Comments: Technically challenging study due to limited acoustic windows. Image acquisition  challenging due to uncooperative patient. IMPRESSIONS  1. Left ventricular ejection fraction, by estimation, is 20 to 25%. The left ventricle has severely decreased function. Left ventricular endocardial border not optimally defined to evaluate regional wall motion. Left ventricular diastolic function could  not be evaluated.  2. Right ventricular systolic function is normal. The right ventricular size is normal. There is normal pulmonary artery systolic pressure. The estimated right ventricular systolic pressure is 24.5 mmHg.  3. The mitral valve was not well visualized. No evidence of mitral valve regurgitation.  4. The aortic valve was not well visualized. Aortic valve regurgitation is mild. No aortic stenosis is present.  5. The inferior vena cava is normal in size with greater than 50% respiratory variability, suggesting right atrial pressure of 3 mmHg. FINDINGS  Left Ventricle: Left ventricular ejection fraction, by estimation, is 20 to 25%. The left ventricle has severely decreased function. Left ventricular endocardial border not optimally defined to evaluate regional wall motion. The left ventricular internal cavity size was normal in size. There is no left ventricular hypertrophy. Left ventricular diastolic function could not be evaluated due to atrial fibrillation. Left ventricular diastolic function could not be evaluated. Right Ventricle: The right ventricular size is normal. No increase in right ventricular wall thickness. Right ventricular systolic function is normal. There is normal pulmonary artery systolic pressure. The tricuspid regurgitant velocity is 2.32 m/s, and  with an assumed right atrial pressure of 3 mmHg, the estimated right ventricular systolic pressure is 24.5 mmHg. Left Atrium: Left atrial size was not well visualized. Right Atrium: Right atrial size was not well visualized. Pericardium: Trivial pericardial effusion is present. Mitral Valve: The mitral valve was not well visualized.  No evidence of mitral valve regurgitation. MV peak gradient, 3.8 mmHg. The mean mitral valve gradient is 1.5 mmHg. Tricuspid Valve: The tricuspid valve  is grossly normal. Tricuspid valve regurgitation is trivial. No evidence of tricuspid stenosis. Aortic Valve: The aortic valve was not well visualized. Aortic valve regurgitation is mild. Aortic regurgitation PHT measures 573 msec. No aortic stenosis is present. Aortic valve mean gradient measures 2.0 mmHg. Aortic valve peak gradient measures 4.2 mmHg. Aortic valve area, by VTI measures 2.17 cm. Pulmonic Valve: The pulmonic valve was not well visualized. Aorta: The aortic root is normal in size and structure. Venous: The inferior vena cava is normal in size with greater than 50% respiratory variability, suggesting right atrial pressure of 3 mmHg. IAS/Shunts: The interatrial septum was not well visualized.  LEFT VENTRICLE PLAX 2D LVOT diam:     1.80 cm     Diastology LV SV:         27          LV e' medial:    5.58 cm/s LV SV Index:   14          LV E/e' medial:  13.6 LVOT Area:     2.54 cm    LV e' lateral:   5.58 cm/s                            LV E/e' lateral: 13.6  LV Volumes (MOD) LV vol d, MOD A2C: 53.1 ml LV vol d, MOD A4C: 56.2 ml LV vol s, MOD A2C: 42.0 ml LV vol s, MOD A4C: 45.7 ml LV SV MOD A2C:     11.1 ml LV SV MOD A4C:     56.2 ml LV SV MOD BP:      11.3 ml RIGHT VENTRICLE RV Basal diam:  2.80 cm RV S prime:     32.50 cm/s TAPSE (M-mode): 1.5 cm LEFT ATRIUM             Index       RIGHT ATRIUM          Index LA Vol (A2C):   12.7 ml 6.63 ml/m  RA Area:     9.33 cm LA Vol (A4C):   11.8 ml 6.16 ml/m  RA Volume:   18.90 ml 9.87 ml/m LA Biplane Vol: 12.9 ml 6.74 ml/m  AORTIC VALVE AV Area (Vmax):    1.84 cm AV Area (Vmean):   1.85 cm AV Area (VTI):     2.17 cm AV Vmax:           102.45 cm/s AV Vmean:          70.850 cm/s AV VTI:            0.126 m AV Peak Grad:      4.2 mmHg AV Mean Grad:      2.0 mmHg LVOT Vmax:         74.10 cm/s LVOT Vmean:         51.600 cm/s LVOT VTI:          0.108 m LVOT/AV VTI ratio: 0.85 AI PHT:            573 msec  AORTA Ao Root diam: 3.50 cm MITRAL VALVE               TRICUSPID VALVE MV Area (PHT): 6.63 cm    TR Peak grad:   21.5 mmHg MV Area VTI:   2.18 cm    TR Vmax:        232.00 cm/s MV Peak grad:  3.8 mmHg MV Mean grad:  1.5 mmHg    SHUNTS MV Vmax:       0.98 m/s    Systemic VTI:  0.11 m MV Vmean:      50.1 cm/s   Systemic Diam: 1.80 cm MV Decel Time: 115 msec MV E velocity: 75.65 cm/s MV A velocity: 89.40 cm/s MV E/A ratio:  0.85 Lennie Odor MD Electronically signed by Lennie Odor MD Signature Date/Time: 10/08/2022/10:26:39 AM    Final     Cardiac Studies  Echo 10/08/22 1. Left ventricular ejection fraction, by estimation, is 20 to 25%. The  left ventricle has severely decreased function. Left ventricular  endocardial border not optimally defined to evaluate regional wall motion.  Left ventricular diastolic function could   not be evaluated.   2. Right ventricular systolic function is normal. The right ventricular  size is normal. There is normal pulmonary artery systolic pressure. The  estimated right ventricular systolic pressure is 24.5 mmHg.   3. The mitral valve was not well visualized. No evidence of mitral valve  regurgitation.   4. The aortic valve was not well visualized. Aortic valve regurgitation  is mild. No aortic stenosis is present.   5. The inferior vena cava is normal in size with greater than 50%  respiratory variability, suggesting right atrial pressure of 3 mmHg.    Patient Profile  Lennard Tuell is a 72 y.o. male with a hx of CAD, HTN, EtoH abuse who is being seen 10/08/2022 for the evaluation of afib with RVR at the request of Dr Jerral Ralph.   Assessment & Plan    1  Afib   Pt has had no recurrence of atrial fibrillation.   Would continue to follow on telemetry   He is getting scheduled IV lopressor since not taking PO Not a candidate for anticoagulation  2  HFrEF  New dx    Etiology may be mixed (ischemic, nonischemic)   Given other medial issues plan for medical Rx   Unfortunately this is currently limited given psych issues     Lack of cooperation/combativeness limiting treatment   OVerall volume status appears OK    3  CAD  Known CAD.  REmote cath in 2010 (Inf STEMI   mild dz LAD; D RCA occluded   Underwent PTCA/DES )   Pt has had no evaluation since  He is currenty without angina     Follow   4  HTN  BP is labile    110s to 140s/ 80s-122    Follow  Not taking PO    Will  sign off.   Please call with questions.      For questions or updates, please contact [facility] Please consult [email or web address] for contact info under        Signed, Dietrich Pates, MD  10/10/2022, 8:16 AM

## 2022-10-10 NOTE — Consult Note (Addendum)
Palliative Medicine Inpatient Consult Note  Consulting Provider: Dr. Jerral Ralph  Reason for consult:   Palliative Care Consult Services Palliative Medicine Consult  Reason for Consult? goc   10/10/2022  HPI:  Per intake H&P --> Patient is a 72 y.o.  male with history of CAD-s/p PCI 2010, HTN, HLD, alcohol use-who was brought to the ED by EMS/PD-under IVC by family for self-neglect/recurrent falls/refusal of care.    Palliative care has been asked to get involved to further address goals of care in the setting of self-neglect as well as acute on chronic medical conditions.  Clinical Assessment/Goals of Care:  *Please note that this is a verbal dictation therefore any spelling or grammatical errors are due to the "Dragon Medical One" system interpretation.  I have reviewed medical records including EPIC notes, labs and imaging, received report from bedside RN, assessed the patient who is very suspicious of me telling me to get away from him, making threats of hitting. It is clear that Mato is quite disoriented.    I met with patients spouse, Elease Hashimoto at bedside to further discuss diagnosis prognosis, GOC, EOL wishes, disposition and options.   I introduced Palliative Medicine as specialized medical care for people living with serious illness. It focuses on providing relief from the symptoms and stress of a serious illness. The goal is to improve quality of life for both the patient and the family.  Medical History Review and Understanding:  A discussion of Cataldo's past medical history inclusive of his coronary artery disease, hypertension, hyperlipidemia, severe alcohol abuse, and newly identified heart failure was held.  Social History:  Comer is from [facility].  He has been married to his wife, Elease Hashimoto since 64.  He had a daughter who unfortunately died tragically.  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  He is a man of faith and was raised  within the [employer] denomination.  Functional and Nutritional State:  Preceding about 3 weeks ago Kairee was fully functional in fact his wife shares that he would help her when needed as she suffers from her own level of chronic debility.  Aryel was able to dress himself, bathe himself, prepare meals though within the last 1-3 weeks he has not been able to do anything nor has he been able to provide adequate self nutrition.  Advance Directives:  A detailed discussion was had today regarding advanced directives.  Patient had never created an advanced directive though legally his wife, Emersyn Ostergard is a Runner, broadcasting/film/video.  Code Status:  Concepts specific to code status, artifical feeding and hydration, continued IV antibiotics and rehospitalization was had.  The difference between a aggressive medical intervention path  and a palliative comfort care path for this patient at this time was had.   Encouraged patient/family to consider DNR/DNI status understanding evidenced based poor outcomes in similar hospitalized patient, as the cause of arrest is likely associated with advanced chronic/terminal illness rather than an easily reversible acute cardio-pulmonary event. I explained that DNR/DNI does not change the medical plan and it only comes into effect after a person has arrested (died).  It is a protective measure to keep [place] from harming the patient in their last moments of life. Elease Hashimoto (patients wife) was agreeable to DNR/DNI with understanding that patient would not receive CPR, defibrillation, ACLS medications, or intubation.   We discussed artificial nutrition as well though there is great concern if this were even trialed Rylon would dislodge any type  of feeding tube placed.  We also reviewed the utility of artificial nutrition in patients who are transitioning in terms of the dying process.  Discussion:  Elease Hashimoto and I reviewed the natural trajectory of alcohol abuse syndrome.  We  reviewed some of the things that he is experiencing now his coronary artery disease as well as his significant heart failure unfortunately are quite common.  We discussed the reality that when patients neglect to eat and drink they are often encroaching upon their end-of-life journey unless an organic cause can be identified.  Elease Hashimoto is well aware of this reality.  Elease Hashimoto shares that Mcgwire has declined over the past 2 years in the setting of his brother's death.  It has though become most notable in the last year in terms of his altered mental status and incremental aggressive behaviors.  We reviewed allowing time for outcomes and if Ledon neglects to improve in the oncoming days further discussions related to hospice.  Patient's wife is interested in understanding more about inpatient hospice specifically from [facility] as that is the closest inpatient hospice to her.  I described hospice as a service for patients who have a life expectancy of 6 months or less.  The goal of hospice is the preservation of dignity and quality at the end phases of life. Under hospice care, the focus changes from curative to symptom relief.  I shared openly and honestly that should Croll need comfort care we would need to aggressively manage his agitational behaviors which wife is aware of.  Discussed the importance of continued conversation with family and their  medical providers regarding overall plan of care and treatment options, ensuring decisions are within the context of the patients values and GOCs.  Decision Maker: Lance Muss (Spouse):[phone] (Mobile)   SUMMARY OF RECOMMENDATIONS   DNAR/DNI  Allow time for outcomes --> 72 hours trial of present interventions if not improvement further conversations related to comfort focused care. If comfort is elected then Aiden will require aggressive symptom management for agitation  Appreciate Chaplain support for patient and his wife as she worries he  may not be saved  Appreciate Hospice liaison meeting with patients wife to better describe inpatient hospice   Ongoing PMT support  Code Status/Advance Care Planning: DNAR/DNI  Palliative Prophylaxis:  Aspiration, Bowel Regimen, Delirium Protocol, Frequent Pain Assessment, Oral Care, Palliative Wound Care, and Turn Reposition  Additional Recommendations (Limitations, Scope, Preferences): Continue current care  Psycho-social/Spiritual:  Desire for further Chaplaincy support: Yes-[facility] Additional Recommendations: Education on alcohol abuse and long-term effects of this   Prognosis: If patient continues to have adult failure to thrive anticipate very limited life expectancy of weeks  Discharge Planning: Discharge plan unclear at this time  Vitals:   10/10/22 0800 10/10/22 1200  BP: (!) 145/107 (!) 135/98  Pulse:    Resp: 13 14  Temp:    SpO2:      Intake/Output Summary (Last 24 hours) at 10/10/2022 1403 Last data filed at 10/10/2022 0600 Gross per 24 hour  Intake 53 ml  Output 400 ml  Net -347 ml   Last Weight  Most recent update: 10/08/2022 12:06 AM    Weight  74.1 kg (163 lb 5.8 oz)            Gen: Elderly African-American male in no acute distress HEENT: moist mucous membranes CV: Regular rate and rhythm PULM: On RA, breathing is even and nonlabored ABD: soft/nontender  EXT: Muscle wasting, cachectic  Neuro: Suspicious unclear what orientation is as  will not engage  PPS: 20%   This conversation/these recommendations were discussed with patient primary care team, Dr. Jerral Ralph  Billing based on MDM: High  Problems Addressed: One acute or chronic illness or injury that poses a threat to life or bodily function  Amount and/or Complexity of Data: Category 3:Discussion of management or test interpretation with external physician/other qualified health care professional/appropriate source (not separately reported)  Risks: Decision not to resuscitate or to  de-escalate care because of poor prognosis ______________________________________________________ Lamarr Lulas [facility] Palliative Medicine Team Team Cell Phone: [phone] Please utilize secure chat with additional questions, if there is no response within 30 minutes please call the above phone number  Palliative Medicine Team providers are available by phone from 7am to 7pm daily and can be reached through the team cell phone.  Should this patient require assistance outside of these hours, please call the patient's attending physician.

## 2022-10-10 NOTE — Care Management Important Message (Signed)
Important Message  Patient Details  Name: Gabriel Becker MRN: [id number] Date of Birth: [birth year]   Medicare Important Message Given:  Yes     [name] 10/10/2022, 3:00 PM

## 2022-10-11 ENCOUNTER — Inpatient Hospital Stay (HOSPITAL_COMMUNITY): Payer: Medicare Other

## 2022-10-11 DIAGNOSIS — M6282 Rhabdomyolysis: Secondary | ICD-10-CM | POA: Diagnosis not present

## 2022-10-11 DIAGNOSIS — E785 Hyperlipidemia, unspecified: Secondary | ICD-10-CM | POA: Diagnosis not present

## 2022-10-11 DIAGNOSIS — R627 Adult failure to thrive: Secondary | ICD-10-CM | POA: Diagnosis not present

## 2022-10-11 DIAGNOSIS — F1010 Alcohol abuse, uncomplicated: Secondary | ICD-10-CM | POA: Diagnosis not present

## 2022-10-11 LAB — COMPREHENSIVE METABOLIC PANEL
ALT: 13 U/L (ref 0–44)
AST: 38 U/L (ref 15–41)
Albumin: 2.6 g/dL — ABNORMAL LOW (ref 3.5–5.0)
Alkaline Phosphatase: 61 U/L (ref 38–126)
Anion gap: 10 (ref 5–15)
BUN: 19 mg/dL (ref 8–23)
CO2: 29 mmol/L (ref 22–32)
Calcium: 9.3 mg/dL (ref 8.9–10.3)
Chloride: 105 mmol/L (ref 98–111)
Creatinine, Ser: 0.8 mg/dL (ref 0.61–1.24)
GFR, Estimated: >60 mL/min (ref >60)
Glucose, Bld: 98 mg/dL (ref 70–99)
Potassium: 3.4 mmol/L — ABNORMAL LOW (ref 3.5–5.1)
Sodium: 144 mmol/L (ref 135–145)
Total Bilirubin: 0.7 mg/dL (ref 0.3–1.2)
Total Protein: 5.9 g/dL — ABNORMAL LOW (ref 6.5–8.1)

## 2022-10-11 LAB — MAGNESIUM
Magnesium: 1.7 mg/dL (ref 1.7–2.4)
Magnesium: 2.4 mg/dL (ref 1.7–2.4)
Magnesium: 2.2 mg/dL (ref 1.7–2.4)

## 2022-10-11 LAB — GLUCOSE, CAPILLARY
Glucose-Capillary: 99 mg/dL (ref 70–99)
Glucose-Capillary: 128 mg/dL — ABNORMAL HIGH (ref 70–99)

## 2022-10-11 LAB — CK: Total CK: 391 U/L (ref 49–397)

## 2022-10-11 LAB — PHOSPHORUS
Phosphorus: 2.9 mg/dL (ref 2.5–4.6)
Phosphorus: 3 mg/dL (ref 2.5–4.6)
Phosphorus: 3.1 mg/dL (ref 2.5–4.6)

## 2022-10-11 MED ORDER — FOLIC ACID 5 MG/ML IJ SOLN
1.0000 mg | Freq: Every day | INTRAMUSCULAR | Status: DC
  Administered 2022-10-11 – 2022-10-14 (×4): 1 mg via INTRAVENOUS
  Filled 2022-10-11 (×4): qty 0.2

## 2022-10-11 MED ORDER — MAGNESIUM SULFATE 2 GM/50ML IV SOLN
2.0000 g | Freq: Once | INTRAVENOUS | Status: AC
  Administered 2022-10-11: 2 g via INTRAVENOUS
  Filled 2022-10-11: qty 50

## 2022-10-11 MED ORDER — MIRTAZAPINE 15 MG PO TBDP
15.0000 mg | ORAL_TABLET | Freq: Every day | ORAL | Status: DC
  Administered 2022-10-11 – 2022-10-15 (×5): 15 mg via NASOGASTRIC
  Filled 2022-10-11 (×5): qty 1

## 2022-10-11 MED ORDER — PROSOURCE TF20 ENFIT COMPATIBL EN LIQD
60.0000 mL | Freq: Every day | ENTERAL | Status: DC
  Administered 2022-10-11 – 2022-10-14 (×4): 60 mL
  Filled 2022-10-11 (×4): qty 60

## 2022-10-11 MED ORDER — MIRTAZAPINE 15 MG PO TBDP: 15.0000 mg | ORAL_TABLET | Freq: Every day | ORAL | Status: DC

## 2022-10-11 MED ORDER — JEVITY 1.5 CAL/FIBER PO LIQD
1000.0000 mL | ORAL | Status: DC
  Administered 2022-10-11: 1000 mL
  Filled 2022-10-11 (×5): qty 1000

## 2022-10-11 MED ORDER — FREE WATER
150.0000 mL | Status: DC
  Administered 2022-10-11 – 2022-10-14 (×18): 150 mL

## 2022-10-11 MED ORDER — POTASSIUM CHLORIDE CRYS ER 20 MEQ PO TBCR: 40.0000 meq | EXTENDED_RELEASE_TABLET | Freq: Once | ORAL | Status: DC

## 2022-10-11 MED ORDER — QUETIAPINE FUMARATE 25 MG PO TABS
25.0000 mg | ORAL_TABLET | Freq: Two times a day (BID) | ORAL | Status: DC | PRN
  Administered 2022-10-14 (×2): 25 mg via NASOGASTRIC
  Filled 2022-10-11 (×2): qty 1

## 2022-10-11 NOTE — Progress Notes (Signed)
Palliative Medicine Inpatient Follow Up Note HPI: Patient is a 72 y.o.  male with history of CAD-s/p PCI 2010, HTN, HLD, alcohol use-who was brought to the ED by EMS/PD-under IVC by family for self-neglect/recurrent falls/refusal of care.     Palliative care has been asked to get involved to further address goals of care in the setting of self-neglect as well as acute on chronic medical conditions.  Today's Discussion 10/11/2022  *Please note that this is a verbal dictation therefore any spelling or grammatical errors are due to the "Dragon Medical One" system interpretation.  Chart reviewed inclusive of vital signs, progress notes, laboratory results, and diagnostic images.   I met with Winchester this morning in the presence of his sitter. She shares that he has not wanted to eat anything and will not allow her to feed him.   Hershell was dry spitting at me when I got close to him and told me to get away.  I was able to call Stacie's spouse, Elease Hashimoto this morning. We discussed his ongoing failure to thrive. I again shared my concerns that with artificial nutrition Memphys is very likely to dislodged the feeding tube. She expresses understanding though is in agreement with a trial of short term tube feeding to see if it makes a difference.   Created space and opportunity for Elease Hashimoto to explore thoughts feelings and fears regarding Jahkeem's current medical situation.She is hopeful that he will pull through. We discussed again if he does not or continues to decline that there will not be much we can offer beyond keeping him comfortable.   We discussed the plan to see how Nomar does over the weekend and have a family meeting on Monday at 1300. In the presence of Elease Hashimoto, her sisters Nicholos Johns and Annice Pih as well as Eleftherios's two grandson Neita Goodnight and Orlie Pollen.   Questions and concerns addressed/Palliative Support Provided.   Objective Assessment: Vital Signs Vitals:   10/11/22 0600 10/11/22 0800  BP:  (!) 154/111   Pulse: 100   Resp:  16  Temp:  98.3 F (36.8 C)  SpO2:  98%    Intake/Output Summary (Last 24 hours) at 10/11/2022 1610 Last data filed at 10/10/2022 2313 Gross per 24 hour  Intake 3 ml  Output 525 ml  Net -522 ml   Last Weight  Most recent update: 10/08/2022 12:06 AM    Weight  74.1 kg (163 lb 5.8 oz)            Gen: Elderly African-American male in no acute distress HEENT: moist mucous membranes CV: Regular rate and rhythm PULM: On RA, breathing is even and nonlabored ABD: soft/nontender  EXT: Muscle wasting, cachectic  Neuro: Suspicious unclear what orientation is as will not engage  SUMMARY OF RECOMMENDATIONS   DNAR/DNI  Placement of coretrack today   Allow time for outcomes --> trial of coretrack feedings   Appreciate Chaplain support for patient and his wife as she worries he may not be saved   Appreciate Hospice liaison meeting with patients wife to better describe inpatient hospice  Plan for family meeting on Monday at 1300    Ongoing PMT support  Billing based on MDM: High ______________________________________________________________________________________ Lamarr Lulas  Palliative Medicine Team Team Cell Phone: [phone] Please utilize secure chat with additional questions, if there is no response within 30 minutes please call the above phone number  Palliative Medicine Team providers are available by phone from 7am to 7pm daily and can be reached through the team  cell phone.  Should this patient require assistance outside of these hours, please call the patient's attending physician.
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
## 2022-10-11 NOTE — Procedures (Signed)
Cortrak  Person Inserting Tube:  [name] T, RD Tube Type:  Cortrak - 43 inches Tube Size:  10 Tube Location:  Left nare Secured by: Bridle Technique Used to Measure Tube Placement:  Marking at nare/corner of mouth Cortrak Secured At:  60 cm   Cortrak Tube Team Note:  Consult received to place a Cortrak feeding tube.   X-ray is required, abdominal x-ray has been ordered by the Cortrak team. Please confirm tube placement before using the Cortrak tube.   If the tube becomes dislodged please keep the tube and contact the Cortrak team at [email or web address] for replacement.  If after hours and replacement cannot be delayed, place a NG tube and confirm placement with an abdominal x-ray.    Gabriel Becker RD, LDN For contact information, refer to AMiON.   

## 2022-10-11 NOTE — Progress Notes (Signed)
Nutrition Follow-up  DOCUMENTATION CODES:  Non-severe (moderate) malnutrition in context of chronic illness  INTERVENTION:  Encourage good PO intake  Discontinue Ensure Enlive  Continue Thiamine, folic acid, and Multivitamin w/ minerals daily Tube feeds via Cortrak once placement is confirmed: Start Jevity 1.5 at 20 mL/hr; advance by 10 mL every 12 hours to goal rate of 50 mL/hr (1200 mL per day) 60 mL ProSource TF20 - Daily Free water flush: 150 mL q4h  Tube Feeds at goal provides 1880 kcal, 97 gm protein, and 1812 mL free water daily.  Monitor magnesium, potassium, and phosphorus BID for at least 3 days, MD to replete as needed, as pt is at risk for refeeding syndrome given refusal to eat and EtOH abuse.   NUTRITION DIAGNOSIS:  Moderate Malnutrition related to chronic illness as evidenced by severe muscle depletion, moderate fat depletion. - New diagnosis  GOAL:  Patient will meet greater than or equal to 90% of their needs - Ongoing  MONITOR:  PO intake, Labs, Weight trends, TF tolerance, I & O's  REASON FOR ASSESSMENT:  Consult Assessment of nutrition requirement/status  ASSESSMENT:  72 y.o. male presented to the ED by EMS under IVC due to multiple falls at home, inability to care for self, and refusal of care from family. PMH includes HTN, HLD, CAD s/p STEMI, and EtOH abuse. Pt admitted with rhabdomyolysis and AKI vs CKD.   6/10 - Admitted 6/14 - Cortrak placed (pending x-ray confirmation)   Cortrak feeding tube placed today. Per MAR, pt refusing all Ensure's, RD will discontinue since plan for enteral nutrition today. Pt did not eat breakfast. Pt continues to refuse medications and food. Plan for trial of tube feeds and family meeting on Monday.  RD saw pt at end of Cortrak placement. Pt received ativan prior to placement and was non-interactive with RD.   Meal Intake 6/11-6/14:0-75% x 3 meals  Medications reviewed and include: Vitamin B12, Folic acid, Remeron, MVI,  Potassium Chloride, Thiamine   Labs reviewed: Sodium 144, Potassium 3.4, BUN 19, Creatinine 0.80, Phosphorus 2.9, Magnesium 1.7  NUTRITION - FOCUSED PHYSICAL EXAM:  Flowsheet Row Most Recent Value  Orbital Region Moderate depletion  Upper Arm Region Moderate depletion  Thoracic and Lumbar Region Moderate depletion  Buccal Region Moderate depletion  Temple Region Severe depletion  Clavicle Bone Region Severe depletion  Clavicle and Acromion Bone Region Severe depletion  Scapular Bone Region Severe depletion  Dorsal Hand Severe depletion  Patellar Region Severe depletion  Anterior Thigh Region Severe depletion  Posterior Calf Region Severe depletion  Edema (RD Assessment) None  Hair Reviewed  Eyes Unable to assess  Mouth Unable to assess  Skin Reviewed  Nails Reviewed   Diet Order:   Diet Order             DIET FINGER FOODS Room service appropriate? No; Fluid consistency: Thin  Diet effective now                  EDUCATION NEEDS: Not appropriate for education at this time  Skin:  Skin Assessment: Reviewed RN Assessment  Last BM:  PTA  Height:  Ht Readings from Last 1 Encounters:  10/07/22 5\' 10"  (1.778 m)   Weight:  Wt Readings from Last 1 Encounters:  10/08/22 74.1 kg   Ideal Body Weight:  75.5 kg  BMI:  Body mass index is 23.44 kg/m.  Estimated Nutritional Needs:  Kcal:  1850-2050 Protein:  90-110 grams Fluid:  >/= 1.8 L   Gabriel Becker  Alroy Dust RD, LDN Clinical Dietitian See Shea Evans for contact information.

## 2022-10-11 NOTE — Progress Notes (Addendum)
This chaplain responded to PMT consult for spiritual care. The chaplain understands the Pt. wife is concerned about the Pt. Salvation.  The chaplain was updated by the Pt. RN-Alan before the visit. The chaplain understands a sitter is present with the Pt. and the Pt. family is not at the bedside.    This chaplain will attempt a revisit with the Pt. and wife.  Chaplain Stephanie Acre [phone]

## 2022-10-12 DIAGNOSIS — E440 Moderate protein-calorie malnutrition: Secondary | ICD-10-CM | POA: Insufficient documentation

## 2022-10-12 DIAGNOSIS — M6282 Rhabdomyolysis: Secondary | ICD-10-CM | POA: Diagnosis not present

## 2022-10-12 LAB — GLUCOSE, CAPILLARY
Glucose-Capillary: 119 mg/dL — ABNORMAL HIGH (ref 70–99)
Glucose-Capillary: 123 mg/dL — ABNORMAL HIGH (ref 70–99)
Glucose-Capillary: 128 mg/dL — ABNORMAL HIGH (ref 70–99)
Glucose-Capillary: 139 mg/dL — ABNORMAL HIGH (ref 70–99)
Glucose-Capillary: 149 mg/dL — ABNORMAL HIGH (ref 70–99)

## 2022-10-12 LAB — COMPREHENSIVE METABOLIC PANEL
ALT: 13 U/L (ref 0–44)
AST: 29 U/L (ref 15–41)
Albumin: 2.3 g/dL — ABNORMAL LOW (ref 3.5–5.0)
Alkaline Phosphatase: 59 U/L (ref 38–126)
Anion gap: 10 (ref 5–15)
BUN: 21 mg/dL (ref 8–23)
CO2: 29 mmol/L (ref 22–32)
Calcium: 8.8 mg/dL — ABNORMAL LOW (ref 8.9–10.3)
Chloride: 103 mmol/L (ref 98–111)
Creatinine, Ser: 0.8 mg/dL (ref 0.61–1.24)
GFR, Estimated: >60 mL/min (ref >60)
Glucose, Bld: 123 mg/dL — ABNORMAL HIGH (ref 70–99)
Potassium: 3 mmol/L — ABNORMAL LOW (ref 3.5–5.1)
Sodium: 142 mmol/L (ref 135–145)
Total Bilirubin: 0.7 mg/dL (ref 0.3–1.2)
Total Protein: 5.4 g/dL — ABNORMAL LOW (ref 6.5–8.1)

## 2022-10-12 LAB — PHOSPHORUS: Phosphorus: 3.1 mg/dL (ref 2.5–4.6)

## 2022-10-12 LAB — MAGNESIUM: Magnesium: 2 mg/dL (ref 1.7–2.4)

## 2022-10-12 MED ORDER — POTASSIUM CHLORIDE CRYS ER 20 MEQ PO TBCR
40.0000 meq | EXTENDED_RELEASE_TABLET | Freq: Two times a day (BID) | ORAL | Status: AC
  Administered 2022-10-12 (×2): 40 meq via ORAL
  Filled 2022-10-12 (×2): qty 2
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
## 2022-10-13 DIAGNOSIS — M6282 Rhabdomyolysis: Secondary | ICD-10-CM | POA: Diagnosis not present

## 2022-10-13 LAB — GLUCOSE, CAPILLARY
Glucose-Capillary: 102 mg/dL — ABNORMAL HIGH (ref 70–99)
Glucose-Capillary: 113 mg/dL — ABNORMAL HIGH (ref 70–99)
Glucose-Capillary: 132 mg/dL — ABNORMAL HIGH (ref 70–99)
Glucose-Capillary: 135 mg/dL — ABNORMAL HIGH (ref 70–99)
Glucose-Capillary: 143 mg/dL — ABNORMAL HIGH (ref 70–99)

## 2022-10-13 LAB — COMPREHENSIVE METABOLIC PANEL
ALT: 12 U/L (ref 0–44)
AST: 31 U/L (ref 15–41)
Albumin: 2.3 g/dL — ABNORMAL LOW (ref 3.5–5.0)
Alkaline Phosphatase: 62 U/L (ref 38–126)
Anion gap: 12 (ref 5–15)
BUN: 19 mg/dL (ref 8–23)
CO2: 29 mmol/L (ref 22–32)
Calcium: 9.2 mg/dL (ref 8.9–10.3)
Chloride: 103 mmol/L (ref 98–111)
Creatinine, Ser: 0.75 mg/dL (ref 0.61–1.24)
GFR, Estimated: >60 mL/min (ref >60)
Glucose, Bld: 126 mg/dL — ABNORMAL HIGH (ref 70–99)
Potassium: 3.7 mmol/L (ref 3.5–5.1)
Sodium: 144 mmol/L (ref 135–145)
Total Bilirubin: 0.4 mg/dL (ref 0.3–1.2)
Total Protein: 5.5 g/dL — ABNORMAL LOW (ref 6.5–8.1)

## 2022-10-13 LAB — CBC WITH DIFFERENTIAL/PLATELET
Abs Immature Granulocytes: 0.09 10*3/uL — ABNORMAL HIGH (ref 0.00–0.07)
Basophils Absolute: 0 10*3/uL (ref 0.0–0.1)
Basophils Relative: 0 %
Eosinophils Absolute: 0.1 10*3/uL (ref 0.0–0.5)
Eosinophils Relative: 1 %
HCT: 32.4 % — ABNORMAL LOW (ref 39.0–52.0)
Hemoglobin: 10.4 g/dL — ABNORMAL LOW (ref 13.0–17.0)
Immature Granulocytes: 1 %
Lymphocytes Relative: 27 %
Lymphs Abs: 1.7 10*3/uL (ref 0.7–4.0)
MCH: 30.6 pg (ref 26.0–34.0)
MCHC: 32.1 g/dL (ref 30.0–36.0)
MCV: 95.3 fL (ref 80.0–100.0)
Monocytes Absolute: 0.8 10*3/uL (ref 0.1–1.0)
Monocytes Relative: 12 %
Neutro Abs: 3.7 10*3/uL (ref 1.7–7.7)
Neutrophils Relative %: 59 %
Platelets: 157 10*3/uL (ref 150–400)
RBC: 3.4 MIL/uL — ABNORMAL LOW (ref 4.22–5.81)
RDW: 13.9 % (ref 11.5–15.5)
WBC: 6.4 10*3/uL (ref 4.0–10.5)
nRBC: 0 % (ref 0.0–0.2)

## 2022-10-13 LAB — BRAIN NATRIURETIC PEPTIDE: B Natriuretic Peptide: 213 pg/mL — ABNORMAL HIGH (ref 0.0–100.0)

## 2022-10-13 LAB — PHOSPHORUS: Phosphorus: 3 mg/dL (ref 2.5–4.6)

## 2022-10-13 LAB — MAGNESIUM: Magnesium: 2 mg/dL (ref 1.7–2.4)
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
## 2022-10-13 NOTE — Plan of Care (Signed)
  Problem: Education: Goal: Knowledge of General Education information will improve Description Including pain rating scale, medication(s)/side effects and non-pharmacologic comfort measures Outcome: Progressing   Problem: Health Behavior/Discharge Planning: Goal: Ability to manage health-related needs will improve Outcome: Progressing   

## 2022-10-13 NOTE — Progress Notes (Signed)
Palliative Medicine Inpatient Follow Up Note HPI: Patient is a 72 y.o.  male with history of CAD-s/p PCI 2010, HTN, HLD, alcohol use-who was brought to the ED by EMS/PD-under IVC by family for self-neglect/recurrent falls/refusal of care.     Palliative care has been asked to get involved to further address goals of care in the setting of self-neglect as well as acute on chronic medical conditions.  Today's Discussion 10/13/2022  *Please note that this is a verbal dictation therefore any spelling or grammatical errors are due to the "[employer]" system interpretation.  Chart reviewed inclusive of vital signs, progress notes, laboratory results, and diagnostic images.   I met with Clayton this morning in the presence of his sitter. He was more pleasant and able to converse in terms of his name. He did allow me to do a more dedicated exam today. He did not spit at me.   Per patients RN, he has been less aggressive.   I called patients spouse, Elease Hashimoto this morning. We discussed that Khale is less verbally aggressive though I do worry about his overall long term prognosis in the setting of his CAD, and severe CHF. I shared that Parag is still not presenting a desire to eat/drink. Elease Hashimoto is understanding to the reality that he is not fairing well, nor has been for a matter of months now. She shares that she is not sure "where he is going to go." I stated that if the family were to desire more aggressive measures he would require a long term gastrostomy tube and around the clock care, likely in a facility. Per Elease Hashimoto this is not something that he ever would have desired given how much he liked to interact with people and be outside.   Alternatively we discussed the idea of focusing on dignity, quality, and comfort. This would be a less aggressive measures though would allow for Jurell to have a natural passing from this earth. We reviewed that additional conversations will occur tomorrow though  starting to really consider what Shantel would want is imperative. Elease Hashimoto understood this.   Objective Assessment: Vital Signs Vitals:   10/13/22 0400 10/13/22 1100  BP: 121/81 (!) 140/99  Pulse: 85 93  Resp: 16 20  Temp: (!) 97.3 F (36.3 C)   SpO2: 99%     Intake/Output Summary (Last 24 hours) at 10/13/2022 1107 Last data filed at 10/13/2022 1106 Gross per 24 hour  Intake 3 ml  Output 250 ml  Net -247 ml    Last Weight  Most recent update: 10/12/2022  8:01 AM    Weight  61.1 kg (134 lb 11.2 oz)            Gen: Elderly African-American male in no acute distress HEENT: moist mucous membranes CV: Regular rate and rhythm PULM: On RA, breathing is even and nonlabored ABD: soft/nontender  EXT: Muscle wasting, cachectic  Neuro: Knows name though does not engage beyond this  SUMMARY OF RECOMMENDATIONS   DNAR/DNI   Allow time for outcomes --> trial of coretrack feedings started on 6/14   Appreciate Chaplain support   Plan for family meeting on Monday at 1300    Ongoing PMT support --> I will not be present tomorrow though a PMT member will assist in the GOC meeting  Billing based on MDM: High ______________________________________________________________________________________ Lamarr Lulas [place] Palliative Medicine Team Team Cell Phone: [phone] Please utilize secure chat with additional questions, if there is no response within 30 minutes please  call the above phone number  Palliative Medicine Team providers are available by phone from 7am to 7pm daily and can be reached through the team cell phone.  Should this patient require assistance outside of these hours, please call the patient's attending physician.

## 2022-10-14 ENCOUNTER — Inpatient Hospital Stay (HOSPITAL_COMMUNITY): Payer: Medicare Other

## 2022-10-14 DIAGNOSIS — E440 Moderate protein-calorie malnutrition: Secondary | ICD-10-CM

## 2022-10-14 DIAGNOSIS — Z515 Encounter for palliative care: Secondary | ICD-10-CM | POA: Diagnosis not present

## 2022-10-14 DIAGNOSIS — R451 Restlessness and agitation: Secondary | ICD-10-CM

## 2022-10-14 LAB — CBC WITH DIFFERENTIAL/PLATELET
Abs Immature Granulocytes: 0.14 10*3/uL — ABNORMAL HIGH (ref 0.00–0.07)
Basophils Absolute: 0 10*3/uL (ref 0.0–0.1)
Basophils Relative: 0 %
Eosinophils Absolute: 0 10*3/uL (ref 0.0–0.5)
Eosinophils Relative: 0 %
HCT: 32.6 % — ABNORMAL LOW (ref 39.0–52.0)
Hemoglobin: 10.8 g/dL — ABNORMAL LOW (ref 13.0–17.0)
Immature Granulocytes: 1 %
Lymphocytes Relative: 16 %
Lymphs Abs: 1.6 10*3/uL (ref 0.7–4.0)
MCH: 31.5 pg (ref 26.0–34.0)
MCHC: 33.1 g/dL (ref 30.0–36.0)
MCV: 95 fL (ref 80.0–100.0)
Monocytes Absolute: 1 10*3/uL (ref 0.1–1.0)
Monocytes Relative: 10 %
Neutro Abs: 7 10*3/uL (ref 1.7–7.7)
Neutrophils Relative %: 73 %
Platelets: 179 10*3/uL (ref 150–400)
RBC: 3.43 MIL/uL — ABNORMAL LOW (ref 4.22–5.81)
RDW: 13.8 % (ref 11.5–15.5)
WBC: 9.7 10*3/uL (ref 4.0–10.5)
nRBC: 0 % (ref 0.0–0.2)

## 2022-10-14 LAB — COMPREHENSIVE METABOLIC PANEL
ALT: 15 U/L (ref 0–44)
AST: 30 U/L (ref 15–41)
Albumin: 2.5 g/dL — ABNORMAL LOW (ref 3.5–5.0)
Alkaline Phosphatase: 71 U/L (ref 38–126)
Anion gap: 10 (ref 5–15)
BUN: 14 mg/dL (ref 8–23)
CO2: 25 mmol/L (ref 22–32)
Calcium: 8.9 mg/dL (ref 8.9–10.3)
Chloride: 102 mmol/L (ref 98–111)
Creatinine, Ser: 0.75 mg/dL (ref 0.61–1.24)
GFR, Estimated: >60 mL/min (ref >60)
Glucose, Bld: 138 mg/dL — ABNORMAL HIGH (ref 70–99)
Potassium: 3.7 mmol/L (ref 3.5–5.1)
Sodium: 137 mmol/L (ref 135–145)
Total Bilirubin: 0.6 mg/dL (ref 0.3–1.2)
Total Protein: 6 g/dL — ABNORMAL LOW (ref 6.5–8.1)

## 2022-10-14 LAB — GLUCOSE, CAPILLARY
Glucose-Capillary: 145 mg/dL — ABNORMAL HIGH (ref 70–99)
Glucose-Capillary: 125 mg/dL — ABNORMAL HIGH (ref 70–99)
Glucose-Capillary: 132 mg/dL — ABNORMAL HIGH (ref 70–99)

## 2022-10-14 LAB — TSH: TSH: 2.715 u[IU]/mL (ref 0.350–4.500)

## 2022-10-14 LAB — BRAIN NATRIURETIC PEPTIDE: B Natriuretic Peptide: 637.9 pg/mL — ABNORMAL HIGH (ref 0.0–100.0)

## 2022-10-14 LAB — MAGNESIUM: Magnesium: 1.8 mg/dL (ref 1.7–2.4)

## 2022-10-14 MED ORDER — MORPHINE SULFATE (CONCENTRATE) 10 MG/0.5ML PO SOLN
5.0000 mg | ORAL | Status: DC | PRN
  Administered 2022-10-15 (×2): 5 mg via ORAL
  Filled 2022-10-14 (×3): qty 0.5

## 2022-10-14 NOTE — Progress Notes (Signed)
Patient ID: Gabriel Becker, male   DOB: [birth year], 72 y.o.   MRN: [id number]    Progress Note from the Palliative Medicine Team at [facility]   Patient Name: Gabriel Becker        Date: 10/14/2022 DOB: [birth year]  Age: 72 y.o. MRN#: [id number] Attending Physician: Leroy Sea, MD Primary Care Physician: Pcp, No Admit Date: 10/07/2022    Extensive chart review has been completed prior to meeting with patient/family  including labs, vital signs, imaging, progress/consult notes, orders, medications and available advance directive documents.    72 y.o.  male with history of CAD-s/p PCI 2010, HTN, HLD, alcohol use-who was brought to the ED by EMS/PD-under IVC by family for self-neglect/recurrent falls/refusal of care.      Palliative care has been asked to get involved to further address goals of care in the setting of self-neglect as well as acute on chronic medical conditions.   Family face treatment option decisions, advanced directive decisions and anticipatory care needs.  This NP assessed patient at the bedside and met as scheduled with family for ongoing conversation regarding current medical situation and pending treatment option decisions.  Patient's wife Gabriel Becker, 2 of patient's sister-in-law's, and patient's grandson/ Gabriel Becker present at bedside   Created space and opportunity for family to explore patient's current medical situation.  On further exploration family recognize that patient has had continued physical, functional and cognitive decline over the past year.  Family verbalized that they questions a pending dementia  Over the past 3 to 4 weeks he has basically stopped eating and drinking and sleeps most of the day.  They report significant weight loss        They speak about their appreciation for Gabriel Becker and for his many talents.  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  Gabriel Becker never went for primary  healthcare, did not like taking medications and "did not like to be doctored".  All express significant decline secondary to the loss of his 2 brothers in recent time   Education offered on concept specific to human mortality and adult failure to thrive.  Education offered on the difference between a full medical support path versus a comfort path. Detailed comfort path foregoing life-prolonging measures; artificial feeding, labs and correction, diagnostics allowing for natural death.   Plan of care - DNR/DNI - Discontinue tube feeds and free water/ No artificial feeding or hydration now or in the future -Comfort diet as tolerated -Further diagnostics, lab draws -Symptom management to enhance comfort and dignity/minimize medications that are not adding to comfort measures -Assess in 24 to 48 hours for appropriate transition of care  Family educated on hospice benefit; philosophy and eligibility.   Education offered on hospice benefit both at home and inpatient residential hospice facility   Wife and all family members present support shift to comfort, enhancing dignity and allowing for natural death.  PMT will continue to support holistically  Questions and concerns addressed   Discussed with Dr Thedore Mins   Time: 75 minutes  Detailed review of medical records ( labs, imaging, vital signs), medically appropriate exam ( MS, skin, resp)   discussed with treatment team, counseling and education to patient, family, staff, documenting clinical information, medication management, coordination of care    Lorinda Creed NP  Palliative Medicine Team Team Phone # [phone] Pager [phone]

## 2022-10-14 NOTE — Progress Notes (Signed)
PROGRESS NOTE        PATIENT DETAILS Name: Gabriel Becker Age: 72 y.o. Sex: male Date of Birth: 05/07/1950 Admit Date: 10/07/2022 Admitting Physician Charlsie Quest, MD PCP:Pcp, No  Brief Summary: Patient is a 72 y.o.  male with history of CAD-s/p PCI 2010, HTN, HLD, alcohol use-who was brought to the ED by EMS/PD-under IVC by family for self-neglect/recurrent falls/refusal of care.  Apparently he was father passed several months ago-and has been on a downward spiral.  See below for further details.  Significant events: 6/10>> admit to [facility] 6/11>> PAF with RVR 6/13>> spoke with spouse-family meeting scheduled for 1 PM with medical team. 6/14>> palliative care discussed with spouse-cortrak tube placed  Significant studies: 6/10>> CT head: No acute intracranial abnormality. 6/10>> CT C-spine: No fracture/subluxation 6/11>> TSH: Normal 6/11>> vitamin B12: 187 (low) 6/11>> vitamin B1: Pending 6/11>> echo: EF 20-25%. 6/12>> RPR: Nonreactive.  Significant microbiology data: None  Procedures: None  Consults: Psych Cardiology Palliative care   Subjective:  in bed resting, likely more, today denies any headache or chest pain, does not want to eat food.  Objective:  Vitals:  Blood pressure (!) 120/99, pulse 99, temperature 99.2 F (37.3 C), temperature source Axillary, resp. rate 18, height 5\' 10"  (1.778 m), weight 61 kg, SpO2 95 %.   Exam:  Awake and calm today, will answer basic questions, still will not follow commands, No new F.N deficits, NG tube in place [place].AT,PERRAL Supple Neck, No JVD,   Symmetrical Chest wall movement, Good air movement bilaterally, CTAB RRR,No Gallops, Rubs or new Murmurs,  +ve B.Sounds, Abd Soft, No tenderness,   No Cyanosis, Clubbing or edema     Assessment/Plan:  Nontraumatic rhabdomyolysis Secondary to immobility-probably recurrent falls CK has trended down with just supportive care  AKI Hemodynamically  mediated Resolved.  Hypernatremia Secondary to poor oral intake Improved with gentle IV fluid hydration Due to HFrEF-no longer on IVF-very poor oral intake but sodium levels are relatively stable.  Hypokalemia Replaced  Paroxysmal atrial fibrillation with RVR Had a brief run of A-fib with RVR on 6/11-maintaining sinus rhythm since then Not a candidate for anticoagulation at this point-noncompliant-psych issues prevent reliable oral intake-he has been refusing his oral medications. TSH stable Echo as above Cardiology has signed off.  Newly diagnosed chronic systolic heart failure with EF 25%. Euvolemic Given active psych issues-doubt further workup is  required.  He currently refusing all oral medications.  If ever psych issues stabilize-we can contemplate starting GDMT medications. Cardiology has signed off.  Minimally elevated troponin Demand ischemia Echo with suppressed EF-see above-not a candidate at this point to undergo any sort of ischemic evaluation.   CAD s/p PCI to RCA 2010 Does not seem to have anginal symptoms-although poor historian-very uncooperative. 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  Per psychiatry-patient lacks capacity to make decisions.  No response- to 3 days of high-dose IV  thiamine-switched back to regular dose thiamine 6/14 After discussion with palliative care-and spouse-trial of NG tube  feedings over the weekend-to see how he does.  Family meeting scheduled for 1 PM this coming Monday to discuss disposition options-goals of care-perhaps hospice options if he remains unchanged.  After discussions with patient's wife it is clear that patient's oral intake had been declining for several months prior to hospitalization and this is just the tipping point.   Family aware that placing an NG tube is a short-term option -had long discussions with patient's wife twice, again on 10/14/2022, she agrees that if patient is unwilling to have food by mouth then we should not prolong his discomfort further, she is leaning towards comfort measures.  She is looking forward for palliative care meeting today on 10/14/2022 to make her final decision.  Of note patient's wife does not want to be responsible for patient's expenses, requested her to talk to her legal advisor for those matters.    BMI Estimated body mass index is 19.3 kg/m as calculated from the following:   Height as of this encounter: 5\' 10"  (1.778 m).   Weight as of this encounter: 61 kg.   Code status:   Code Status: DNR   DVT Prophylaxis: enoxaparin (LOVENOX) injection 40 mg Start: 10/07/22 1915   Family Communication: Spouse-[name]-[phone]-updated over the phone, 10/12/22, detailed message left on 10/13/2022 at 9:10 AM, updated on 10/14/22   Disposition Plan: Status is: Inpatient Remains inpatient appropriate because: Severity of illness   Planned Discharge Destination:Skilled nursing facility   Diet: Diet Order             DIET FINGER FOODS Room service appropriate? No; Fluid consistency: Thin  Diet effective now                   Antimicrobial agents: Anti-infectives (From admission, onward)    None      MEDICATIONS: Scheduled Meds:  cyanocobalamin  1,000 mcg Subcutaneous Q2000   enoxaparin (LOVENOX) injection  40 mg Subcutaneous Q24H   feeding supplement (PROSource TF20)  60 mL Per Tube Daily    folic acid  1 mg Intravenous Daily   free water  150 mL Per Tube Q4H   metoprolol tartrate  2.5 mg Intravenous Q8H   mirtazapine  15 mg Per NG tube QHS   multivitamin with minerals  1 tablet Oral Daily   sodium chloride flush  3 mL Intravenous Q12H   thiamine (VITAMIN B1) injection  100 mg Intravenous Daily   Continuous Infusions:  feeding supplement (JEVITY 1.5 CAL/FIBER) 1,000 mL (10/11/22 1500)   PRN Meds:.acetaminophen **OR** acetaminophen, ondansetron **OR** ondansetron (ZOFRAN) IV, mouth rinse, QUEtiapine, senna-docusate   I have personally reviewed following labs and imaging studies  LABORATORY DATA:  Recent Labs  Lab 10/07/22 1423 10/08/22 0043 10/13/22 0321 10/14/22 0352  WBC 10.0 8.3 6.4 9.7  HGB 14.8 13.7 10.4* 10.8*  HCT 45.5 42.3 32.4* 32.6*  PLT 201 159 157 179  MCV 95.8 96.8 95.3 95.0  MCH 31.2 31.4 30.6 31.5  MCHC 32.5 32.4 32.1 33.1  RDW 14.0 13.9 13.9 13.8  LYMPHSABS 1.5  --  1.7 1.6  MONOABS 0.7  --  0.8 1.0  EOSABS 0.0  --  0.1 0.0  BASOSABS 0.0  --  0.0 0.0    Recent Labs  Lab 10/07/22 1603 10/07/22 2000 10/08/22 0043 10/08/22 0538 10/09/22 0208 10/10/22 1228 10/11/22 0315 10/11/22 1355 10/11/22 1734 10/12/22 0458 10/13/22 0321 10/14/22 0352  NA  --   --  151*   < > 144 145 144  --   --  142 144 137  K  --   --  2.9*   < > 3.3* 4.3 3.4*  --   --  3.0* 3.7 3.7  CL  --   --  104   < > 102 106 105  --   --  103 103 102  CO2  --   --  27   < > 27 31 29   --   --  29 29 25   ANIONGAP  --   --  20*   < > 15 8 10   --   --  10 12 10   GLUCOSE  --   --  124*   < > 113* 106* 98  --   --  123* 126* 138*  BUN  --   --  59*   < > 36* 20 19  --   --  21 19 14   CREATININE  --   --  1.52*   < > 1.08 0.74 0.80  --   --  0.80 0.75 0.75  AST  --   --  63*  --  57*  --  38  --   --  29 31 30   ALT  --   --  16  --  15  --  13  --   --  13 12 15   ALKPHOS  --   --  53  --  50  --  61  --   --  59 62 71  BILITOT  --   --  1.0  --  0.8  --  0.7  --   --  0.7  0.4 0.6  ALBUMIN  --   --  2.9*  --  2.7*  --  2.6*  --   --  2.3* 2.3* 2.5*  LATICACIDVEN 2.9* 1.6  --   --   --   --   --   --   --   --   --   --   TSH  --   --  0.840  --   --   --   --   --   --   --   --  2.715  BNP  --   --   --   --   --   --   --   --   --   --  213.0* 637.9*  MG  --   --   --    < > 2.0 1.9 1.7 2.4 2.2 2.0 2.0 1.8  CALCIUM  --   --  9.4   < > 9.4 9.0 9.3  --   --  8.8* 9.2 8.9   < > = values in this interval not displayed.    Lab Results  Component Value Date   CHOL 178 10/25/2013   HDL 42.60 10/25/2013   LDLCALC 96 10/25/2013   LDLDIRECT 110.0 02/24/2013   TRIG 198.0 (H) 10/25/2013   CHOLHDL 4 10/25/2013     Recent Labs  Lab 10/07/22 1603 10/07/22 2000 10/08/22 0043 10/08/22 0538 10/10/22 1228 10/11/22 0315 10/11/22 1355 10/11/22 1734 10/12/22 0458 10/13/22 0321 10/14/22 0352  LATICACIDVEN 2.9* 1.6  --   --   --   --   --   --   --   --   --   TSH  --   --  0.840  --   --   --   --   --   --   --  2.715  BNP  --   --   --   --   --   --   --   --   --  213.0* 637.9*  MG  --   --   --    < > 1.9 1.7 2.4 2.2 2.0 2.0 1.8  CALCIUM  --   --  9.4   < > 9.0 9.3  --   --  8.8* 9.2 8.9   < > = values in this interval not displayed.     MICROBIOLOGY: No results found for this or any previous visit (from the past 240 hour(s)).  RADIOLOGY STUDIES/RESULTS: DG Chest Port 1 View  Result Date: 10/14/2022 CLINICAL DATA:  [id number] with shortness of breath. EXAM: PORTABLE CHEST 1 VIEW COMPARISON:  10/07/2022 portable chest FINDINGS: 6:54 a.m. interval insertion Dobbhoff feeding tube, adequately intragastric with the tip in the proximal stomach curving cephalad to the left. The lungs are clear. No pleural effusion is seen. The cardiomediastinal silhouette and vasculature are normal. There is calcification of the transverse aorta. Thoracic spondylosis. IMPRESSION: Interval insertion of a Dobbhoff feeding tube, adequately intragastric with the tip in the proximal  stomach. No evidence of acute chest disease. Electronically Signed   By: Almira Bar M.D.   On: 10/14/2022 07:13     LOS: 7 days   Signature  -    Susa Raring M.D on 10/14/2022 at 7:27 AM   -  To page go to [email or web address]

## 2022-10-14 NOTE — Progress Notes (Signed)
Nutrition Brief Note  Chart reviewed. Pt now transitioning to comfort care. Tube feeds have been discontinued. Diet is already ordered, please allow pt to eat as desired.   No further nutrition interventions planned at this time. Please re-consult as needed.    Kirby Crigler RD, LDN Clinical Dietitian See Loretha Stapler for contact information.

## 2022-10-15 DIAGNOSIS — Z7189 Other specified counseling: Secondary | ICD-10-CM | POA: Diagnosis not present

## 2022-10-15 DIAGNOSIS — Z515 Encounter for palliative care: Secondary | ICD-10-CM | POA: Diagnosis not present

## 2022-10-15 LAB — GLUCOSE, CAPILLARY: Glucose-Capillary: 86 mg/dL (ref 70–99)

## 2022-10-15 MED ORDER — MORPHINE SULFATE (CONCENTRATE) 10 MG/0.5ML PO SOLN
5.0000 mg | Freq: Four times a day (QID) | ORAL | Status: DC
  Administered 2022-10-15 – 2022-10-16 (×5): 5 mg via ORAL
  Filled 2022-10-15 (×4): qty 0.5

## 2022-10-15 MED ORDER — DIVALPROEX SODIUM 125 MG PO CSDR
250.0000 mg | DELAYED_RELEASE_CAPSULE | Freq: Two times a day (BID) | ORAL | Status: DC
  Administered 2022-10-15 – 2022-10-16 (×3): 250 mg via ORAL
  Filled 2022-10-15 (×3): qty 2

## 2022-10-15 NOTE — Progress Notes (Signed)
Patient ID: Sanjay Mintzer, male   DOB: 1950-07-08, 72 y.o.   MRN: [id number]    Progress Note from the Palliative Medicine Team at [facility]   Patient Name: Jakeb Camejo        Date: 10/15/2022 DOB: Dec 28, 1950  Age: 72 y.o. MRN#: [id number] Attending Physician: Leroy Sea, MD Primary Care Physician: Pcp, No Admit Date: 10/07/2022  Extensive chart review has been completed prior to assessing  patient at bedside  including labs, vital signs, imaging, progress/consult notes, orders, medications and available advance directive documents.    72 y.o.  male with history of CAD-s/p PCI 2010, HTN, HLD, alcohol use-who was brought to the ED by EMS/PD-under IVC by family for self-neglect/recurrent falls/refusal of care.      Palliative care has been asked to get involved to further address goals of care in the setting of self-neglect as well as acute on chronic medical conditions.   Family face treatment option decisions, advanced directive decisions and anticipatory care needs.  Patient does not have medical decision making capacity  Patient continues to fail to thrive, very little PO intake.  Easily agitated on any attempts at interaction.  Does not follow simple commands.   I spoke to patient's wife by telephone.ongoing conversation regarding current medical situation and updated her on patient's condition under decided full comfort path.  She was concerned about patient having sore gums.  Unable to assess integrity of buccal membranes, too agitated  I was able to get Mr Sinanan to take several sips of orange juice from a spoon from me.  Wife is tearful as she processes anticipated los of her husband.  Continued education regarding hospices services.  Will make referral to hospice tomorrow with hope of transition to residential hospice.  Plan of care: -DNR/DNI -no artifical feeding or hydration now or in the future -no further diagnostics or life prolonging measures -symptom  management      -Agitation- Depakote sprinkles 250 mg po every 12 hours scheduled      - Chronic pain/ wife reports patient with continued back and leg pain prior to hospitalization   Roxanol 5 mg po/sl every 6 hrs scheduled - Residential hospice for EOL care- will discuss in morning   Comfort and dignity are focus of care allowing for a natural death.  Prognosis is  likely days to a week  Education offered on natural trajectory and expectations at EOL.   PMT will continue to support holistically  Questions and concerns addressed   Discussed with TOC team   Time: 50  minutes  Detailed review of medical records ( labs, imaging, vital signs), medically appropriate exam ( MS, skin, resp)   discussed with treatment team, counseling and education to patient, family, staff, documenting clinical information, medication management, coordination of care    Lorinda Creed NP  Palliative Medicine Team Team Phone # [phone] Pager [phone]

## 2022-10-15 NOTE — Progress Notes (Signed)
PROGRESS NOTE        PATIENT DETAILS Name: Gabriel Becker Age: 72 y.o. Sex: male Date of Birth: 06/01/1950 Admit Date: 10/07/2022 Admitting Physician Charlsie Quest, MD PCP:Pcp, No  Brief Summary: Patient is a 72 y.o.  male with history of CAD-s/p PCI 2010, HTN, HLD, alcohol use-who was brought to the ED by EMS/PD-under IVC by family for self-neglect/recurrent falls/refusal of care.  Apparently he was father passed several months ago-and has been on a downward spiral.  See below for further details.  Significant events: 6/10>> admit to [facility] 6/11>> PAF with RVR 6/13>> spoke with spouse-family meeting scheduled for 1 PM with medical team. 6/14>> palliative care discussed with spouse-cortrak tube placed  Significant studies: 6/10>> CT head: No acute intracranial abnormality. 6/10>> CT C-spine: No fracture/subluxation 6/11>> TSH: Normal 6/11>> vitamin B12: 187 (low) 6/11>> vitamin B1: Pending 6/11>> echo: EF 20-25%. 6/12>> RPR: Nonreactive.  Significant microbiology data: None  Procedures: None  Consults: Psych Cardiology Palliative care   Subjective:  in bed resting, likely more, today denies any headache or chest pain, does not want to eat food.  Objective:  Vitals:  Blood pressure (!) 145/95, pulse 100, temperature 98 F (36.7 C), temperature source Axillary, resp. rate 17, height 5\' 10"  (1.778 m), weight 61 kg, SpO2 97 %.   Exam:  Awake and calm today, will answer basic questions, still will not follow commands, No new F.N deficits, NG tube in place [place].AT,PERRAL Supple Neck, No JVD,   Symmetrical Chest wall movement, Good air movement bilaterally, CTAB RRR,No Gallops, Rubs or new Murmurs,  +ve B.Sounds, Abd Soft, No tenderness,   No Cyanosis, Clubbing or edema     Assessment/Plan:  Discussions between palliative care team and family patient was transition to comfort measures on 10/14/2022 evening, awaiting for hospice bed.  Likely  discharge to hospice with full comfort measures in the next 1 to 2 days.     Nontraumatic rhabdomyolysis Secondary to immobility-probably recurrent falls CK has trended down with just supportive care  AKI Hemodynamically mediated Resolved.  Hypernatremia Secondary to poor oral intake Improved with gentle IV fluid hydration Due to HFrEF-no longer on IVF-very poor oral intake but sodium levels are relatively stable.  Hypokalemia Replaced  Paroxysmal atrial fibrillation with RVR Had a brief run of A-fib with RVR on 6/11-maintaining sinus rhythm since then Not a candidate for anticoagulation at this point-noncompliant-psych issues prevent reliable oral intake-he has been refusing his oral medications. TSH stable Echo as above Cardiology has signed off.  Newly diagnosed chronic systolic heart failure with EF 25%. Euvolemic Given active psych issues-doubt further workup is  required.  He currently refusing all oral medications.  If ever psych issues stabilize-we can contemplate starting GDMT medications. Cardiology has signed off.  Minimally elevated troponin Demand ischemia Echo with suppressed EF-see above-not a candidate at this point to undergo any sort of ischemic evaluation.   CAD s/p PCI to RCA 2010 Does not seem to have anginal symptoms-although poor historian-very uncooperative. 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  Per psychiatry-patient lacks capacity to make decisions.  No response- to 3 days of high-dose IV thiamine-switched back to regular dose thiamine 6/14 After discussion with palliative care-and spouse-trial of NG tube feedings over the weekend-to see how he does.  Family meeting scheduled for 1 PM this coming Monday to discuss disposition options-goals of care-perhaps hospice options if he remains unchanged.  After discussions with patient's wife it is clear that patient's oral intake had been declining for several months prior to hospitalization and this is just the tipping point.   Family aware that placing an NG tube is a short-term option -had long discussions with patient's wife twice, again on 10/14/2022, she agrees that if patient is unwilling to have food by mouth then we should not prolong his discomfort further, she is leaning towards comfort measures.  She is looking forward for palliative care meeting today on 10/14/2022 to make her final decision.  Of note patient's wife does not want to be responsible for patient's expenses, requested her to talk to her legal advisor for those matters.    BMI Estimated body mass index is 19.3 kg/m as calculated from the following:   Height as of this encounter: 5\' 10"  (1.778 m).   Weight as of this encounter: 61 kg.   Code status:   Code Status: DNR   DVT Prophylaxis:    Family Communication: Spouse-[name]-[phone]-updated over the phone, 10/12/22, detailed message left on 10/13/2022 at 9:10 AM, updated on 10/14/22   Disposition Plan: Status is: Inpatient Remains inpatient appropriate because: Severity of illness   Planned Discharge Destination:Skilled nursing facility   Diet: Diet Order             DIET FINGER FOODS Room service appropriate? No; Fluid consistency: Thin  Diet effective now                   Antimicrobial agents: Anti-infectives (From admission, onward)    None      MEDICATIONS: Scheduled  Meds:  divalproex  250 mg Oral Q12H   metoprolol tartrate  2.5 mg Intravenous Q8H   mirtazapine  15 mg Per NG tube QHS   morphine CONCENTRATE  5 mg Oral Q6H   sodium chloride flush  3 mL Intravenous Q12H   Continuous Infusions:   PRN Meds:.acetaminophen **OR** acetaminophen, morphine CONCENTRATE, ondansetron **OR** ondansetron (ZOFRAN) IV, mouth rinse, QUEtiapine, senna-docusate   I have personally reviewed following labs and imaging studies  LABORATORY DATA:  Recent Labs  Lab 10/13/22 0321 10/14/22 0352  WBC 6.4 9.7  HGB 10.4* 10.8*  HCT 32.4* 32.6*  PLT 157 179  MCV 95.3 95.0  MCH 30.6 31.5  MCHC 32.1 33.1  RDW 13.9 13.8  LYMPHSABS 1.7 1.6  MONOABS 0.8 1.0  EOSABS 0.1 0.0  BASOSABS 0.0 0.0    Recent Labs  Lab 10/09/22 0208 10/10/22 1228 10/11/22 0315 10/11/22 1355 10/11/22 1734 10/12/22 0458 10/13/22 0321 10/14/22 0352  NA 144 145 144  --   --  142 144 137  K 3.3* 4.3 3.4*  --   --  3.0* 3.7 3.7  CL 102 106 105  --   --  103 103 102  CO2 27 31 29   --   --  29 29 25   ANIONGAP 15 8 10   --   --  10 12 10   GLUCOSE 113* 106* 98  --   --  123* 126* 138*  BUN 36* 20 19  --   --  21 19 14   CREATININE 1.08 0.74 0.80  --   --  0.80 0.75 0.75  AST 57*  --  38  --   --  29 31 30   ALT 15  --  13  --   --  13 12 15   ALKPHOS 50  --  61  --   --  59 62 71  BILITOT 0.8  --  0.7  --   --  0.7 0.4 0.6  ALBUMIN 2.7*  --  2.6*  --   --  2.3* 2.3* 2.5*  TSH  --   --   --   --   --   --   --  2.715  BNP  --   --   --   --   --   --  213.0* 637.9*  MG 2.0 1.9 1.7 2.4 2.2 2.0 2.0 1.8  CALCIUM 9.4 9.0 9.3  --   --  8.8* 9.2 8.9    Lab Results  Component Value Date   CHOL 178 10/25/2013   HDL 42.60 10/25/2013   LDLCALC 96 10/25/2013   LDLDIRECT 110.0 02/24/2013   TRIG 198.0 (H) 10/25/2013   CHOLHDL 4 10/25/2013     Recent Labs  Lab 10/10/22 1228 10/11/22 0315 10/11/22 1355 10/11/22 1734 10/12/22 0458 10/13/22 0321 10/14/22 0352  TSH  --   --   --   --   --    --  2.715  BNP  --   --   --   --   --  213.0* 637.9*  MG 1.9 1.7 2.4 2.2 2.0 2.0 1.8  CALCIUM 9.0 9.3  --   --  8.8* 9.2 8.9     MICROBIOLOGY: No results found for this or any previous visit (from the past 240 hour(s)).  RADIOLOGY STUDIES/RESULTS: DG Chest Port 1 View  Result Date: 10/14/2022 CLINICAL DATA:  161096 with shortness of breath. EXAM: PORTABLE CHEST 1 VIEW COMPARISON:  10/07/2022 portable chest FINDINGS: 6:54 a.m. interval insertion Dobbhoff feeding tube, adequately intragastric with the tip in the proximal stomach curving cephalad to the left. The lungs are clear. No pleural effusion is seen. The cardiomediastinal silhouette and vasculature are normal. There is calcification of the transverse aorta. Thoracic spondylosis. IMPRESSION: Interval insertion of a Dobbhoff feeding tube, adequately intragastric with the tip in the proximal stomach. No evidence of acute chest disease. Electronically Signed   By: Almira Bar M.D.   On: 10/14/2022 07:13     LOS: 8 days   Signature  -    Susa Raring M.D on 10/15/2022 at 10:10 AM   -  To page go to [email or web address]

## 2022-10-16 DIAGNOSIS — M6282 Rhabdomyolysis: Secondary | ICD-10-CM | POA: Diagnosis not present

## 2022-10-16 MED ORDER — DIVALPROEX SODIUM 125 MG PO CSDR: 250.0000 mg | DELAYED_RELEASE_CAPSULE | Freq: Two times a day (BID) | ORAL | Status: AC

## 2022-10-16 MED ORDER — QUETIAPINE FUMARATE 25 MG PO TABS: 25.0000 mg | ORAL_TABLET | Freq: Two times a day (BID) | ORAL | Status: AC

## 2022-10-16 MED ORDER — NITROGLYCERIN 0.4 MG SL SUBL: 0.4000 mg | SUBLINGUAL_TABLET | SUBLINGUAL | 12 refills | Status: AC | PRN

## 2022-10-16 MED ORDER — MIRTAZAPINE 15 MG PO TBDP: 15.0000 mg | ORAL_TABLET | Freq: Every day | ORAL | Status: AC

## 2022-10-16 NOTE — TOC Initial Note (Signed)
Transition of Care [facility]) - Initial/Assessment Note    Patient Details  Name: Gabriel Becker MRN: [id number] Date of Birth: 11-01-1950  Transition of Care [facility] CM/SW Contact:    Mearl Latin, LCSW Phone Number: 10/16/2022, 9:41 AM  Clinical Narrative:                 CSW received consult for [facility] Referral as preference of patient's spouse. Referral sent to [facility] for review.   Expected Discharge Plan: Hospice Medical Facility Barriers to Discharge: Hospice Bed not available   Patient Goals and CMS Choice Patient states their goals for this hospitalization and ongoing recovery are:: Comfort CMS Medicare.gov Compare Post Acute Care list provided to:: Patient Represenative (must comment) Choice offered to / list presented to : Spouse [place] ownership interest in [facility].provided to:: Spouse    Expected Discharge Plan and Services In-house Referral: Clinical Social Work, Hospice / Palliative Care   Post Acute Care Choice: Hospice Living arrangements for the past 2 months: Single Family Home Expected Discharge Date: 10/16/22                                    Prior Living Arrangements/Services Living arrangements for the past 2 months: Single Family Home Lives with:: Spouse Patient language and need for interpreter reviewed:: Yes Do you feel safe going back to the place where you live?: Yes      Need for Family Participation in Patient Care: Yes (Comment) Care giver support system in place?: Yes (comment)   Criminal Activity/Legal Involvement Pertinent to Current Situation/Hospitalization: No - Comment as needed  Activities of Daily Living      Permission Sought/Granted Permission sought to share information with : Facility Medical sales representative, Family Supports Permission granted to share information with : Yes, Verbal Permission Granted  Share Information with NAME: Elease Hashimoto  Permission granted to share info w AGENCY:  Hospice  Permission granted to share info w Relationship: Spouse  Permission granted to share info w Contact Information: [phone]  Emotional Assessment Appearance:: Appears stated age Attitude/Demeanor/Rapport: Unable to Assess Affect (typically observed): Unable to Assess Orientation: : Oriented to Self Alcohol / Substance Use: Alcohol Use Psych Involvement: No (comment)  Admission diagnosis:  Hypernatremia [E87.0] Uremia [N19] Elevated troponin [R79.89] Failure to thrive in adult [R62.7] Non-traumatic rhabdomyolysis [M62.82] Patient Active Problem List   Diagnosis Date Noted   Malnutrition of moderate degree 10/12/2022   Non-traumatic rhabdomyolysis 10/07/2022   Renal insufficiency 10/07/2022   Involuntary commitment 10/07/2022   Failure to thrive in adult 10/07/2022   Alcohol abuse 04/11/2014   Arthritis 04/11/2014   Former smoker 04/11/2014   Hyperlipidemia 03/07/2009   Essential hypertension 03/07/2009   CAD (coronary artery disease) 03/07/2009   PCP:  Pcp, No Pharmacy:   [facility] - [place], [place] - [address] [place] [address] Phone: [phone] Fax: [phone]  [id number] - [place], [place] - [address] [address] Phone: [phone] Fax: [phone]     Social Determinants of Health (SDOH) Social History: SDOH Screenings   Tobacco Use: Medium Risk (10/10/2022)   SDOH Interventions:     Readmission Risk Interventions     No data to display

## 2022-10-16 NOTE — Progress Notes (Signed)
[facility] ([facility]) Hospital Liaison Note   Referral received for patient/family interest in [facility]. Chart under review by [facility] physician.    Bed offered and accepted for transfer today. Unit RN please call report to [phone] prior to patient leaving the unit. Please send signed DNR and paperwork with patient.    Please leave all IV access and ports in place.    Please call with any questions or concerns. Thank you   Shanita Wicker, LCSW [facility] Hospital Liaison [phone] 

## 2022-10-16 NOTE — Discharge Instructions (Signed)
Disposition.  Residential hospice °Condition.  Guarded °CODE STATUS.  DNR °Activity.  With assistance as tolerated, full fall precautions. °Diet.  Soft with feeding assistance and aspiration precautions. °Goal of care.  Comfort. ° °

## 2022-10-16 NOTE — Discharge Summary (Signed)
Gabriel Becker EAV:[id number] DOB: [birth year] DOA: 10/07/2022  PCP: Pcp, No  Admit date: 10/07/2022  Discharge date: 10/16/2022  Admitted From: Home   Disposition:  Residential Hospice   Recommendations for Outpatient Follow-up:   Follow up with PCP in 1-2 weeks  PCP Please obtain BMP/CBC, 2 view CXR in 1week,  (see Discharge instructions)   PCP Please follow up on the following pending results:    Home Health: None   Equipment/Devices: None  Discharge Condition: Guarded CODE STATUS: DNR Diet Recommendation: Soft diet with feeding assistance and aspiration precautions as desired by the patient.    Chief Complaint  Patient presents with   Failure To Thrive     Brief history of present illness from the day of admission and additional interim summary    72 y.o.  male with history of CAD-s/p PCI 2010, HTN, HLD, alcohol use-who was brought to the ED by EMS/PD-under IVC by family for self-neglect/recurrent falls/refusal of care.  Apparently he was father passed several months ago-and has been on a downward spiral.  See below for further details.   Significant events: 6/10>> admit to [facility] 6/11>> PAF with RVR 6/13>> spoke with spouse-family meeting scheduled for 1 PM with medical team. 6/14>> palliative care discussed with spouse-cortrak tube placed   Significant studies: 6/10>> CT head: No acute intracranial abnormality. 6/10>> CT C-spine: No fracture/subluxation 6/11>> TSH: Normal 6/11>> vitamin B12: 187 (low) 6/11>> vitamin B1: Pending 6/11>> echo: EF 20-25%. 6/12>> RPR: Nonreactive.   Significant microbiology data: None   Procedures: None   Consults: Psych Cardiology Palliative care                                                                 Hospital Course   After multiple discussions  between palliative care team and family patient was transition to comfort measures on 10/14/2022 evening, awaiting for hospice bed.  Likely discharge to hospice with full comfort measures in the next 1 to 2 days.       Nontraumatic rhabdomyolysis Secondary to immobility and recurrent falls, treated supportively, now comfort medications only   Paroxysmal atrial fibrillation with RVR Seen by cardiology, now comfort medications only   Newly diagnosed chronic systolic heart failure with EF 25%. Compensated, seen by cardiology, now transition to full comfort.   Minimally elevated troponin Demand ischemia Echo with suppressed EF-see above-not a candidate at this point to undergo any sort of ischemic evaluation.  Now only comfort medications.   CAD s/p PCI to RCA 2010 Stable, now comfort medications only   History of EtOH use No DTs here   Vitamin B12 deficiency  Was given supplementation now only on comfort medications   Failure to thrive, Self-neglect-with refusal of care/agitation, decreased oral intake and refusal to eat, recurrent falls, had long discussion with wife  apparently patient had been gradually declining over several months if not years, this is not a new change in the patient.  He continued to refuse to eat or take medications, was noncooperative with exam, after detailed discussions between the treatment team including the palliative care team and patient's wife it was decided that he will be transitioned to full comfort measures and discharged to residential hospice with goal of care being comfort only.  Note he was seen by psych team and deemed not to have the capacity to decide for himself, while here he was on core track tube feeds.  Now full comfort at residential hospice.    Discharge diagnosis     Principal Problem:   Non-traumatic rhabdomyolysis Active Problems:   Hyperlipidemia   Essential hypertension   CAD (coronary artery disease)   Alcohol abuse   Renal  insufficiency   Involuntary commitment   Failure to thrive in adult   Malnutrition of moderate degree    Discharge instructions    Discharge Instructions     Discharge instructions   Complete by: As directed    Disposition.  Residential hospice Condition.  Guarded CODE STATUS.  DNR Activity.  With assistance as tolerated, full fall precautions. Diet.  Soft with feeding assistance and aspiration precautions. Goal of care.  Comfort.   Increase activity slowly   Complete by: As directed        Discharge Medications   Allergies as of 10/16/2022   No Known Allergies      Medication List     STOP taking these medications    amLODipine 10 MG tablet Commonly known as: NORVASC   aspirin 81 MG tablet   atorvastatin 80 MG tablet Commonly known as: LIPITOR   hydrALAZINE 25 MG tablet Commonly known as: APRESOLINE   losartan-hydrochlorothiazide 100-25 MG tablet Commonly known as: HYZAAR   metoprolol succinate 50 MG 24 hr tablet Commonly known as: TOPROL-XL       TAKE these medications    divalproex 125 MG capsule Commonly known as: DEPAKOTE SPRINKLE Take 2 capsules (250 mg total) by mouth 2 (two) times daily.   mirtazapine 15 MG disintegrating tablet Commonly known as: REMERON SOL-TAB 1 tablet (15 mg total) by Per NG tube route at bedtime.   nitroGLYCERIN 0.4 MG SL tablet Commonly known as: NITROSTAT Place 1 tablet (0.4 mg total) under the tongue every 5 (five) minutes as needed. Take 1 tablet under tongue at onset of chest pain; you may repeat every 5 minutes for up to 3 doses.   QUEtiapine 25 MG tablet Commonly known as: SEROQUEL Take 1 tablet (25 mg total) by mouth 2 (two) times daily.          Major procedures and Radiology Reports - PLEASE review detailed and final reports thoroughly  -     DG Chest Port 1 View  Result Date: 10/14/2022 CLINICAL DATA:  [id number] with shortness of breath. EXAM: PORTABLE CHEST 1 VIEW COMPARISON:  10/07/2022  portable chest FINDINGS: 6:54 a.m. interval insertion Dobbhoff feeding tube, adequately intragastric with the tip in the proximal stomach curving cephalad to the left. The lungs are clear. No pleural effusion is seen. The cardiomediastinal silhouette and vasculature are normal. There is calcification of the transverse aorta. Thoracic spondylosis. IMPRESSION: Interval insertion of a Dobbhoff feeding tube, adequately intragastric with the tip in the proximal stomach. No evidence of acute chest disease. Electronically Signed   By: Almira Bar M.D.   On: 10/14/2022 07:13   DG Abd  Portable 1V  Result Date: 10/11/2022 CLINICAL DATA:  Feeding tube placement. EXAM: PORTABLE ABDOMEN - 1 VIEW COMPARISON:  None Available. FINDINGS: Tip of the weighted enteric tube below the diaphragm in the left upper quadrant in the region of the mid stomach. IMPRESSION: Tip of the weighted enteric tube below the diaphragm in the left upper quadrant in the region of the mid stomach. Electronically Signed   By: Narda Rutherford M.D.   On: 10/11/2022 14:38   ECHOCARDIOGRAM COMPLETE  Result Date: 10/08/2022    ECHOCARDIOGRAM REPORT   Patient Name:   Gabriel Becker Date of Exam: 10/08/2022 Medical Rec #:  [id number]     Height:       70.0 in Accession #:    [id number]    Weight:       163.4 lb Date of Birth:  1950/10/21     BSA:          1.915 m Patient Age:    71 years      BP:           96/83 mmHg Patient Gender: M             HR:           108 bpm. Exam Location:  Inpatient Procedure: 2D Echo, Cardiac Doppler and Color Doppler Indications:    Atrial Fibrillation  History:        Patient has no prior history of Echocardiogram examinations. CAD                 and Previous Myocardial Infarction, Arrythmias:Tachycardia; Risk                 Factors:Dyslipidemia, Hypertension, Former Smoker and alcohol                 abuse.  Sonographer:    Wallie Char Referring Phys: [phone] [facility]  Sonographer Comments: Technically  challenging study due to limited acoustic windows. Image acquisition challenging due to uncooperative patient. IMPRESSIONS  1. Left ventricular ejection fraction, by estimation, is 20 to 25%. The left ventricle has severely decreased function. Left ventricular endocardial border not optimally defined to evaluate regional wall motion. Left ventricular diastolic function could  not be evaluated.  2. Right ventricular systolic function is normal. The right ventricular size is normal. There is normal pulmonary artery systolic pressure. The estimated right ventricular systolic pressure is 24.5 mmHg.  3. The mitral valve was not well visualized. No evidence of mitral valve regurgitation.  4. The aortic valve was not well visualized. Aortic valve regurgitation is mild. No aortic stenosis is present.  5. The inferior vena cava is normal in size with greater than 50% respiratory variability, suggesting right atrial pressure of 3 mmHg. FINDINGS  Left Ventricle: Left ventricular ejection fraction, by estimation, is 20 to 25%. The left ventricle has severely decreased function. Left ventricular endocardial border not optimally defined to evaluate regional wall motion. The left ventricular internal cavity size was normal in size. There is no left ventricular hypertrophy. Left ventricular diastolic function could not be evaluated due to atrial fibrillation. Left ventricular diastolic function could not be evaluated. Right Ventricle: The right ventricular size is normal. No increase in right ventricular wall thickness. Right ventricular systolic function is normal. There is normal pulmonary artery systolic pressure. The tricuspid regurgitant velocity is 2.32 m/s, and  with an assumed right atrial pressure of 3 mmHg, the estimated right ventricular systolic pressure is 24.5 mmHg. Left Atrium: Left atrial  size was not well visualized. Right Atrium: Right atrial size was not well visualized. Pericardium: Trivial pericardial effusion  is present. Mitral Valve: The mitral valve was not well visualized. No evidence of mitral valve regurgitation. MV peak gradient, 3.8 mmHg. The mean mitral valve gradient is 1.5 mmHg. Tricuspid Valve: The tricuspid valve is grossly normal. Tricuspid valve regurgitation is trivial. No evidence of tricuspid stenosis. Aortic Valve: The aortic valve was not well visualized. Aortic valve regurgitation is mild. Aortic regurgitation PHT measures 573 msec. No aortic stenosis is present. Aortic valve mean gradient measures 2.0 mmHg. Aortic valve peak gradient measures 4.2 mmHg. Aortic valve area, by VTI measures 2.17 cm. Pulmonic Valve: The pulmonic valve was not well visualized. Aorta: The aortic root is normal in size and structure. Venous: The inferior vena cava is normal in size with greater than 50% respiratory variability, suggesting right atrial pressure of 3 mmHg. IAS/Shunts: The interatrial septum was not well visualized.  LEFT VENTRICLE PLAX 2D LVOT diam:     1.80 cm     Diastology LV SV:         27          LV e' medial:    5.58 cm/s LV SV Index:   14          LV E/e' medial:  13.6 LVOT Area:     2.54 cm    LV e' lateral:   5.58 cm/s                            LV E/e' lateral: 13.6  LV Volumes (MOD) LV vol d, MOD A2C: 53.1 ml LV vol d, MOD A4C: 56.2 ml LV vol s, MOD A2C: 42.0 ml LV vol s, MOD A4C: 45.7 ml LV SV MOD A2C:     11.1 ml LV SV MOD A4C:     56.2 ml LV SV MOD BP:      11.3 ml RIGHT VENTRICLE RV Basal diam:  2.80 cm RV S prime:     32.50 cm/s TAPSE (M-mode): 1.5 cm LEFT ATRIUM             Index       RIGHT ATRIUM          Index LA Vol (A2C):   12.7 ml 6.63 ml/m  RA Area:     9.33 cm LA Vol (A4C):   11.8 ml 6.16 ml/m  RA Volume:   18.90 ml 9.87 ml/m LA Biplane Vol: 12.9 ml 6.74 ml/m  AORTIC VALVE AV Area (Vmax):    1.84 cm AV Area (Vmean):   1.85 cm AV Area (VTI):     2.17 cm AV Vmax:           102.45 cm/s AV Vmean:          70.850 cm/s AV VTI:            0.126 m AV Peak Grad:      4.2 mmHg AV Mean  Grad:      2.0 mmHg LVOT Vmax:         74.10 cm/s LVOT Vmean:        51.600 cm/s LVOT VTI:          0.108 m LVOT/AV VTI ratio: 0.85 AI PHT:            573 msec  AORTA Ao Root diam: 3.50 cm MITRAL VALVE  TRICUSPID VALVE MV Area (PHT): 6.63 cm    TR Peak grad:   21.5 mmHg MV Area VTI:   2.18 cm    TR Vmax:        232.00 cm/s MV Peak grad:  3.8 mmHg MV Mean grad:  1.5 mmHg    SHUNTS MV Vmax:       0.98 m/s    Systemic VTI:  0.11 m MV Vmean:      50.1 cm/s   Systemic Diam: 1.80 cm MV Decel Time: 115 msec MV E velocity: 75.65 cm/s MV A velocity: 89.40 cm/s MV E/A ratio:  0.85 Lennie Odor MD Electronically signed by Lennie Odor MD Signature Date/Time: 10/08/2022/10:26:39 AM    Final    CT Head Wo Contrast  Result Date: 10/07/2022 CLINICAL DATA:  Falls, failure to thrive. EXAM: CT HEAD WITHOUT CONTRAST TECHNIQUE: Contiguous axial images were obtained from the base of the skull through the vertex without intravenous contrast. RADIATION DOSE REDUCTION: This exam was performed according to the departmental dose-optimization program which includes automated exposure control, adjustment of the mA and/or kV according to patient size and/or use of iterative reconstruction technique. COMPARISON:  None Available. FINDINGS: Brain: No intracranial hemorrhage, mass effect, or midline shift. Moderate generalized atrophy. No hydrocephalus. The basilar cisterns are patent. Moderate to advanced chronic small vessel ischemia. Small remote lacunar infarcts in the basal ganglia. No evidence of territorial infarct or acute ischemia. No extra-axial or intracranial fluid collection. Vascular: Atherosclerosis of skullbase vasculature without hyperdense vessel or abnormal calcification. Skull: No fracture or focal lesion. Sinuses/Orbits: No acute findings. Tiny mucous retention cysts in the maxillary sinuses. No mastoid effusion. Other: None. IMPRESSION: 1. No acute intracranial abnormality. 2. Moderate atrophy and chronic  small vessel ischemia. Electronically Signed   By: Narda Rutherford M.D.   On: 10/07/2022 16:45   CT Cervical Spine Wo Contrast  Result Date: 10/07/2022 CLINICAL DATA:  Falls and failure to thrive EXAM: CT CERVICAL SPINE WITHOUT CONTRAST TECHNIQUE: Multidetector CT imaging of the cervical spine was performed without intravenous contrast. Multiplanar CT image reconstructions were also generated. RADIATION DOSE REDUCTION: This exam was performed according to the departmental dose-optimization program which includes automated exposure control, adjustment of the mA and/or kV according to patient size and/or use of iterative reconstruction technique. COMPARISON:  None Available. FINDINGS: Alignment: Straightening of normal lordosis. No traumatic subluxation. Skull base and vertebrae: No acute fracture. Vertebral body heights are maintained. The dens and skull base are intact. Soft tissues and spinal canal: No prevertebral fluid or swelling. No visible canal hematoma. Disc levels: Moderate diffuse degenerative disc disease with large anterior spurs. Mild to moderate multilevel facet hypertrophy. No high-grade canal stenosis. Upper chest: No acute findings. Other: None. IMPRESSION: Moderate degenerative change throughout the cervical spine without acute fracture or subluxation. Electronically Signed   By: Narda Rutherford M.D.   On: 10/07/2022 16:43   DG Pelvis Portable  Result Date: 10/07/2022 CLINICAL DATA:  Fall EXAM: PORTABLE PELVIS 1 VIEWS COMPARISON:  None Available. FINDINGS: Limited evaluation of the right hip due to patient rotation, additionally portions of the proximal right femur and right pelvis are excluded from the field of view. No evidence of left hip fracture. Degenerative changes of the partially visualized lumbar spine. Mild degenerative changes of the left hip. Vascular calcifications. Soft tissues are unremarkable. IMPRESSION: 1. Limited evaluation of the right hip due to patient rotation,  additionally portions of the proximal right femur and right pelvis are excluded from the field  of view. Consider repeat imaging. 2. No evidence of left hip fracture. Electronically Signed   By: Allegra Lai M.D.   On: 10/07/2022 15:20   DG Chest Portable 1 View  Result Date: 10/07/2022 CLINICAL DATA:  Fall EXAM: PORTABLE CHEST 1 VIEW COMPARISON:  Chest x-ray dated August 26, 2013 FINDINGS: Patient rotation somewhat limits evaluation. Cardiac and mediastinal contours within normal limits. Lungs are clear. No evidence of pleural effusion or pneumothorax. No displaced fracture. IMPRESSION: No acute cardiopulmonary process. Electronically Signed   By: Allegra Lai M.D.   On: 10/07/2022 15:17    Micro Results    No results found for this or any previous visit (from the past 240 hour(s)).  Today   Subjective    Gabriel Becker today has no headache,no chest abdominal pain,no new weakness tingling or numbness, feels much better   Objective   Blood pressure (!) 138/100, pulse 100, temperature 98 F (36.7 C), resp. rate 15, height 5\' 10"  (1.778 m), weight 61 kg, SpO2 94 %.   Intake/Output Summary (Last 24 hours) at 10/16/2022 0722 Last data filed at 10/16/2022 0500 Gross per 24 hour  Intake 30 ml  Output 450 ml  Net -420 ml    Exam  Awake Alert, No new F.N deficits, answers questions but minimally, overall not very cooperative with exam or interview, core track in place [place].AT,PERRAL Supple Neck,   Symmetrical Chest wall movement, Good air movement bilaterally, CTAB RRR,No Gallops,   +ve B.Sounds, Abd Soft, Non tender,  No Cyanosis, Clubbing or edema    Data Review   Recent Labs  Lab 10/13/22 0321 10/14/22 0352  WBC 6.4 9.7  HGB 10.4* 10.8*  HCT 32.4* 32.6*  PLT 157 179  MCV 95.3 95.0  MCH 30.6 31.5  MCHC 32.1 33.1  RDW 13.9 13.8  LYMPHSABS 1.7 1.6  MONOABS 0.8 1.0  EOSABS 0.1 0.0  BASOSABS 0.0 0.0    Recent Labs  Lab 10/10/22 1228 10/11/22 0315  10/11/22 1355 10/11/22 1734 10/12/22 0458 10/13/22 0321 10/14/22 0352  NA 145 144  --   --  142 144 137  K 4.3 3.4*  --   --  3.0* 3.7 3.7  CL 106 105  --   --  103 103 102  CO2 31 29  --   --  29 29 25   ANIONGAP 8 10  --   --  10 12 10   GLUCOSE 106* 98  --   --  123* 126* 138*  BUN 20 19  --   --  21 19 14   CREATININE 0.74 0.80  --   --  0.80 0.75 0.75  AST  --  38  --   --  29 31 30   ALT  --  13  --   --  13 12 15   ALKPHOS  --  61  --   --  59 62 71  BILITOT  --  0.7  --   --  0.7 0.4 0.6  ALBUMIN  --  2.6*  --   --  2.3* 2.3* 2.5*  TSH  --   --   --   --   --   --  2.715  BNP  --   --   --   --   --  213.0* 637.9*  MG 1.9 1.7 2.4 2.2 2.0 2.0 1.8  CALCIUM 9.0 9.3  --   --  8.8* 9.2 8.9    Total Time in preparing paper work,  data evaluation and todays exam - 35 minutes  Signature  -    Susa Raring M.D on 10/16/2022 at 7:22 AM   -  To page go to [email or web address]

## 2022-10-16 NOTE — Progress Notes (Signed)
Patient ID: Gabriel Becker, male   DOB: 10-22-1950, 72 y.o.   MRN: [id number]    Progress Note from the Palliative Medicine Team at [facility]   Patient Name: Gabriel Becker        Date: 10/16/2022 DOB: [birth year]  Age: 72 y.o. MRN#: [id number] Attending Physician: Leroy Sea, MD Primary Care Physician: Pcp, No Admit Date: 10/07/2022  Extensive chart review has been completed prior to assessing  patient at bedside  including labs, vital signs, imaging, progress/consult notes, orders, medications and available advance directive documents.    72 y.o.  male with history of CAD-s/p PCI 2010, HTN, HLD, alcohol use-who was brought to the ED by EMS/PD-under IVC by family for self-neglect/recurrent falls/refusal of care.      Palliative care has been asked to get involved to further address goals of care in the setting of self-neglect as well as acute on chronic medical conditions.   Family face treatment option decisions, advanced directive decisions and anticipatory care needs.  Patient does not have medical decision making capacity  Patient continues to fail to thrive, very little PO intake.  Less agitated today.  Does not follow simple commands.  I spoke to patient's wife by telephone.ongoing conversation regarding current medical situation and updated her on patient's condition under decided full comfort path.  She understands her husband's limited prognosis.  Continued education regarding hospices services.    Patient's wife is in agreement with transition to residential hospice, will place referral.  Plan of care: -DNR/DNI -no artifical feeding or hydration now or in the future -no further diagnostics or life prolonging measures -symptom management      -Agitation- Depakote sprinkles 250 mg po every 12 hours scheduled      - Chronic pain/ wife reports patient with continued back and leg pain prior to hospitalization   Roxanol 5 mg po/sl every 6 hrs scheduled - Residential hospice  for EOL care-   Comfort and dignity are focus of care allowing for a natural death.  Prognosis is  likely days to a week  Education offered on natural trajectory and expectations at EOL.   PMT will continue to support holistically  Questions and concerns addressed   Discussed with TOC team   Time: 50  minutes  Detailed review of medical records ( labs, imaging, vital signs), medically appropriate exam ( MS, skin, resp)   discussed with treatment team, counseling and education to patient, family, staff, documenting clinical information, medication management, coordination of care    Lorinda Creed NP  Palliative Medicine Team Team Phone # [phone] Pager [phone]

## 2022-10-16 NOTE — TOC Transition Note (Signed)
Transition of Care [facility]) - CM/SW Discharge Note   Patient Details  Name: Gabriel Becker MRN: [id number] Date of Birth: 1951/04/23  Transition of Care [facility]) CM/SW Contact:  Mearl Latin, LCSW Phone Number: 10/16/2022, 12:00 PM   Clinical Narrative:    Patient will DC [facility] Anticipated DC date: 10/16/22 Family notified: Spouse Transport by: Sharin Mons   Per MD patient ready for DC to [facility]. RN to call report prior to discharge [phone]). RN, patient, patient's family, and facility notified of DC. Discharge Summary sent to facility. DC packet on chart including signed DNR. Ambulance transport requested for patient.   CSW will sign off for now as social work intervention is no longer needed. Please consult [place] again if new needs arise.     Final next level of care: Hospice Medical Facility Barriers to Discharge: Barriers Resolved   Patient Goals and CMS Choice CMS Medicare.gov Compare Post Acute Care list provided to:: Patient Represenative (must comment) Choice offered to / list presented to : Spouse  Discharge Placement                  Patient to be transferred to facility by: [facility] Name of family member notified: Spouse Patient and family notified of of transfer: 10/16/22  Discharge Plan and Services Additional resources added to the After Visit Summary for   In-house Referral: Clinical Social Work, Hospice / Palliative Care   Post Acute Care Choice: Hospice                               Social Determinants of Health (SDOH) Interventions SDOH Screenings   Tobacco Use: Medium Risk (10/10/2022)     Readmission Risk Interventions     No data to display

## 2022-10-28 DEATH — deceased
